# Patient Record
Sex: Female | Born: 1950 | Race: White | Hispanic: No | Smoking: Never smoker
Health system: Southern US, Community
[De-identification: ages and names within clinical notes are randomized; demographics above are authoritative.]

## PROBLEM LIST (undated history)

## (undated) DIAGNOSIS — IMO0002 Reserved for concepts with insufficient information to code with codable children: Secondary | ICD-10-CM

## (undated) DIAGNOSIS — R928 Other abnormal and inconclusive findings on diagnostic imaging of breast: Secondary | ICD-10-CM

## (undated) DIAGNOSIS — I447 Left bundle-branch block, unspecified: Secondary | ICD-10-CM

## (undated) DIAGNOSIS — F32A Depression, unspecified: Secondary | ICD-10-CM

## (undated) DIAGNOSIS — C50912 Malignant neoplasm of unspecified site of left female breast: Secondary | ICD-10-CM

## (undated) DIAGNOSIS — M797 Fibromyalgia: Secondary | ICD-10-CM

## (undated) DIAGNOSIS — M1990 Unspecified osteoarthritis, unspecified site: Secondary | ICD-10-CM

## (undated) DIAGNOSIS — F419 Anxiety disorder, unspecified: Secondary | ICD-10-CM

## (undated) DIAGNOSIS — F329 Major depressive disorder, single episode, unspecified: Secondary | ICD-10-CM

## (undated) DIAGNOSIS — E039 Hypothyroidism, unspecified: Secondary | ICD-10-CM

## (undated) HISTORY — DX: Other abnormal and inconclusive findings on diagnostic imaging of breast: R92.8

## (undated) HISTORY — DX: Left bundle-branch block, unspecified: I44.7

## (undated) HISTORY — DX: Unspecified osteoarthritis, unspecified site: M19.90

## (undated) HISTORY — DX: Hypothyroidism, unspecified: E03.9

## (undated) HISTORY — DX: Malignant neoplasm of unspecified site of left female breast: C50.912

## (undated) HISTORY — PX: APPENDECTOMY: SHX54

## (undated) HISTORY — DX: Major depressive disorder, single episode, unspecified: F32.9

## (undated) HISTORY — DX: Anxiety disorder, unspecified: F41.9

## (undated) HISTORY — PX: MASTECTOMY: SHX3

## (undated) HISTORY — DX: Depression, unspecified: F32.A

## (undated) HISTORY — DX: Fibromyalgia: M79.7

## (undated) HISTORY — DX: Reserved for concepts with insufficient information to code with codable children: IMO0002

---

## 1971-08-18 DIAGNOSIS — IMO0002 Reserved for concepts with insufficient information to code with codable children: Secondary | ICD-10-CM

## 1971-08-18 HISTORY — DX: Reserved for concepts with insufficient information to code with codable children: IMO0002

## 1979-08-18 HISTORY — PX: BUNIONECTOMY: SHX129

## 1987-08-18 HISTORY — PX: PARTIAL HYSTERECTOMY: SHX80

## 1992-08-17 DIAGNOSIS — C50912 Malignant neoplasm of unspecified site of left female breast: Secondary | ICD-10-CM

## 1992-08-17 HISTORY — DX: Malignant neoplasm of unspecified site of left female breast: C50.912

## 2007-08-18 DIAGNOSIS — M797 Fibromyalgia: Secondary | ICD-10-CM

## 2007-08-18 HISTORY — DX: Fibromyalgia: M79.7

## 2008-06-13 ENCOUNTER — Ambulatory Visit: Payer: Self-pay | Admitting: Family Medicine

## 2008-06-13 DIAGNOSIS — J209 Acute bronchitis, unspecified: Secondary | ICD-10-CM | POA: Insufficient documentation

## 2008-06-13 DIAGNOSIS — E039 Hypothyroidism, unspecified: Secondary | ICD-10-CM | POA: Insufficient documentation

## 2008-06-13 DIAGNOSIS — Z853 Personal history of malignant neoplasm of breast: Secondary | ICD-10-CM

## 2008-07-25 ENCOUNTER — Ambulatory Visit: Payer: Self-pay | Admitting: Family Medicine

## 2008-07-25 DIAGNOSIS — R0602 Shortness of breath: Secondary | ICD-10-CM

## 2008-10-05 ENCOUNTER — Ambulatory Visit: Payer: Self-pay | Admitting: Physical Medicine & Rehabilitation

## 2008-10-26 ENCOUNTER — Encounter
Admission: RE | Admit: 2008-10-26 | Discharge: 2008-10-26 | Payer: Self-pay | Admitting: Physical Medicine & Rehabilitation

## 2008-10-26 ENCOUNTER — Ambulatory Visit: Payer: Self-pay | Admitting: Physical Medicine & Rehabilitation

## 2008-11-09 ENCOUNTER — Telehealth: Payer: Self-pay | Admitting: Family Medicine

## 2008-11-23 ENCOUNTER — Encounter
Admission: RE | Admit: 2008-11-23 | Discharge: 2008-11-23 | Payer: Self-pay | Admitting: Physical Medicine & Rehabilitation

## 2008-11-23 ENCOUNTER — Ambulatory Visit: Payer: Self-pay | Admitting: Physical Medicine & Rehabilitation

## 2008-12-14 ENCOUNTER — Ambulatory Visit: Payer: Self-pay | Admitting: Physical Medicine & Rehabilitation

## 2009-01-11 ENCOUNTER — Ambulatory Visit: Payer: Self-pay | Admitting: Physical Medicine & Rehabilitation

## 2009-03-08 ENCOUNTER — Encounter: Admission: RE | Admit: 2009-03-08 | Discharge: 2009-03-08 | Payer: Self-pay | Admitting: Unknown Physician Specialty

## 2009-03-08 ENCOUNTER — Ambulatory Visit: Payer: Self-pay | Admitting: Physical Medicine & Rehabilitation

## 2009-06-13 ENCOUNTER — Ambulatory Visit: Payer: Self-pay | Admitting: Family Medicine

## 2009-11-27 ENCOUNTER — Ambulatory Visit: Payer: Self-pay | Admitting: Obstetrics & Gynecology

## 2010-12-30 NOTE — Consult Note (Signed)
Consult requested by   A 60 year old female with 10-year history of pain all overher body.  She  has had workup with various physicians over the years.  Most recently,  she has been to [facility] here in [place] where the  patient had a EMG/NCV which was normal.  Additional workup including  blood work was also normal.  She has had MRI of the cervical spine  showed a plaque-like lesions in C4 on the right possible, but no  neurologic correlation.  Had C5-6 disk degeneration unchanged since  2004.  MRI of the brain was normal.   NEUROLOGIC EXAMINATION:  Normal.   MEDICATIONS:  She has never tried Lyrica, Cymbalta, or Savella.  She has  been on tramadol in the past which has been partially helpful.  She has  been on Vioxx which was helpful in the past as well as cyclobenzaprine  which was partially helpful.  Diclofenac has also been helpful.  Unfortunately, she had elevated liver enzymes with diclofenac.   PAST MEDICAL HISTORY:  Also positive for hypothyroidism.  They had to  increase her thyroid dosage over the course of last couple months.   Other past medical history is significant for bronchitis, depression,  headache, sleep difficulty.   FAMILY HISTORY:  Arthritis in mother and father.  Cancer in mother and  father   SOCIAL HISTORY:  Married, one child.  Does not work.  No illicit drug  use.  No alcohol use.  No smoking.   Medications currently include Zoloft 100 one and a half per day.  She is  on various multivitamins such as vitamin D, Estra-C.  She is on senna.  She is on Singulair for asthma, Synthroid for hypothyroidism.  She is  also on p.r.n. Astepro inhaler, trazodone 50 mg at night but this gives  her a hangover effect, but helps her sleep.   ALLERGIES:  CEFZIL, CODEINE, and PENICILLIN.   PHYSICAL EXAMINATION:  14 out of 18 fibromyalgia tender points positive  at bilateral suboccipital, bilateral upper medial scapular border,  bilateral sternocostal,  bilateral back, bilateral hip, bilateral knee,  and bilateral elbow.   Her motor strength is 5/5 in bilateral upper and lower extremities.  Range of motion is normal in bilateral upper and lower extremities.  Sensation normal in bilateral upper and lower extremities.  Deep tendon  reflexes are normal in bilateral upper and lower extremities.   Gait is normal.   Back range of motion is 50% forward flexion and extension.   Neck range of motion is full.  No pain to palpation in the lumbar spine.  No evidence of scoliosis.   IMPRESSION:  1. Fibromyalgia syndrome.  She has not been tried any FDA approved      medications in terms of fibromyalgia.  Given that she is already on      Zoloft, we will institute Lyrica 75 b.i.d. and then go up to t.i.d.      after a week and then I will see her back in about 3 weeks to      follow up on this.  2. In terms exercises, this will be the mainstay of her treatment and      we will go ahead and send her to aquatic therapy at the [facility] from      fibromyalgia program.  3. I will go ahead and consider some physical therapy in 1 month.      Should she wish to do some more vigorous exercise, she  will need      some one-on-one instruction through the [facility] Program here      at [facility].  4. Should she not benefit from the Lyrica, we may help to wean down on      the Zoloft and institute Savella.  Other potential treatment      include anti-inflammatories, but this should be more on an      intermittent basis versus some tramadol.   Thank you for this interesting consultation.  I have spent much of the  visit also with education, both the patient and her husband, including  do's and do not's and mechanism.       Erick Colace, M.D.  Electronically Signed     AEK/MedQ  D:10/05/2008 12:23:47  T:10/06/2008 01:58:39  Job #:  [id number]

## 2010-12-30 NOTE — Assessment & Plan Note (Signed)
A 60 year old female with fibromyalgia syndrome.  She started a low  impact water aerobics, getting some monthly outpatient physical therapy  mainly manual therapy.  Trialed on a TENS unit, had some good temporary  improvement in the PT Clinic, does not have a home unit.  She had a  switch of her medications.  We reduced her Zoloft over the course of 2  weeks from 150 to 0 and started her on Cymbalta 30 mg a day.  We also  took off her Lyrica.  Her mental status is much more clear.  She feels  like she has really had no confusion anymore.  Her anxiety seems a  little bit more than it was before.  We have even took her mother's  Xanax a couple of times, but I cautioned her that this is not only a bad  idea from medical standpoint, but illegal.   Her average pain is 7/10.  Her pain is sharp, burning, dull, stabbing,  tingling, aching, interferes with activity at 7/10 level.  Sleep is  poor.  Pain improves with heat therapies, pacing activities.  She can  walk 20-30 minutes at a time.  She climbs steps.  She drives.  Last  employed 2009.  Pain interferes with meal prep, household duties,  shopping.   REVIEW OF SYSTEMS:  Positive for confusion, depression, anxiety,  coughing, respiratory infections.  Has been seeing her allergist as well  as a cardiologist since she had what it sounds like an adenosine stress  test which was reportedly normal.  Her Oswestry disability index score  of 62%.   Blood pressure 122/72.   Palpation for fibromyalgia tender points upper medial scapular border,  right upper trap, right elbow, total 4.   Motor strength is 5/5 in bilateral lower and upper extremities.  Hip,  knee, and ankle range of motion are normal.   IMPRESSION:  Fibromyalgia syndrome.  She also has depression.  She asked  me whether it was because her depression improved or her fibromyalgia  improved that she is overall doing better.   I think that the Lyrica was causing sedation.  Her  Cymbalta seems to be  controlling depression as well as fibromyalgia symptoms, but some of the  anxiety has been increased likely as a result of coming off the Lyrica.  We will increase her Cymbalta to 60 mg a day.  I will see her back in 2  months.  If she does poorly on this increased dosage, she will call me  back and I will see her sooner.      Erick Colace, M.D.  Electronically Signed     AEK/MedQ  D:  01/11/2009 10:36:23  T:  01/11/2009 23:03:30  Job #:  [id number]   cc:   Dr. Zane Herald, M.D.  Fax: [phone]

## 2010-12-30 NOTE — Assessment & Plan Note (Signed)
Ms. Roeper follows up today.   REASON FOR VISIT:  Fibromyalgia syndrome.   A 60 year old female with fibromyalgia syndrome, has started with low  impact water aerobics, is getting some outpatient physical therapy in  [place].  She had x-rays of her knees showed no evidence of DJD,  questionable loose body, but really this has not been a major issue.  Her major concern is all over body pain.   Lyrica has been causing some drowsiness, dizziness, and wonders if she  can go down on this.  It is 50%, which is baseline versus last visit.   REVIEW OF SYSTEMS:  Positive numbness, tingling, confusion, depression,  anxiety, dizziness, constipation, and swelling.   PHYSICAL EXAMINATION:  VITAL SIGNS:  Blood pressure 111/64 and pulse 78.  BACK:  No tenderness to palpation except a lumbosacral junction.  She  has pain in the upper traps.  Lower extremity and upper extremity  strength are normal.  Range of motion is normal in lower extremities.   IMPRESSION:  Fibromyalgia syndrome.  She is on Lyrica, has side effects  that she does not tolerate, therefore, we will taper down to b.i.d. for  a week, then daily for a week, then discontinue.   We will start Cymbalta 30 mg a day.   We will reduce her Zoloft from 150 daily to 100 daily for a week, and  then to 50 daily for a week, and then discontinue.   Discussed that she might feel nauseated or just a bit off with these  medication changes for the next couple of weeks.  I will see her back in  about a months' time.      Erick Colace, M.D.  Electronically Signed     AEK/MedQ  D:  12/14/2008 12:03:17  T:  12/15/2008 02:23:44  Job #:  [id number]   cc:   Dr. Orie Rout   Dr. Zane Herald, M.D.  Fax: [phone]

## 2010-12-30 NOTE — Assessment & Plan Note (Signed)
A 60 year old female with fibromyalgia syndrome, last seen by me  approximately 1 month ago.  She has followed through and gone to low  impact water aerobics.  She is also started with some physical therapy  in [place].  We have increased her Lyrica to 150 b.i.d., which has  been somewhat helpful, but still do not seem to be getting rid of her  pain as much as she thought it would.  Overall, her mood and affect  appear brighter.  She had x-rays done of bilateral knees and these show  no evidence of DJD, questionable right knee loose bodies.  She does  admits some clicking, but this is really both knees; her right knee may  be a little bit worse than left, but no big differences.  Her visual  analogue scale at last visit was 8, now it is 7.  Her pain interference  scores were 8-9 last visit, now at 5-8 level.   Her Oswestry disability index score, last visit was 60% and 50% at this  visit which is a significant improvement in function.  She can walk 30-  60 minutes at a time.  She drives.  She is independent with all of her  self-care mobility, but needs help with some of her shopping and  household duty tasks.   REVIEW OF SYSTEMS:  Positive confusion, depression, anxiety, dizziness,  numbness, tingling, weakness, constipation, limb swelling, and wheezing.  She does follow up with the primary care physician.   PHYSICAL EXAMINATION:  VITAL SIGNS:  Her blood pressure 115/67, weight  172 pounds.   Palpation for fibromyalgia tender points, no significantly positive  fibromyalgia points at this time.  She complains of her back a lot.  She  has pain with extension of her lumbar spine and has about 50% range  whereas she has about 75% range of forward flexion.  Lower extremity  strength is normal.   IMPRESSION:  1. Fibromyalgia syndrome.  Overall improvement in function and pain.      Sleep has improved as well on Savella as well as her exercise.  2. Back pain, this maybe a facet  arthropathy.  She has pain with      extension greater than flexion.  No radicular symptomatology.  We      will check some x-rays.  3. 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  I have asked      her therapist to include some terminal knee extension exercises as      well as some lumbar flexion exercises.      Erick Colace, M.D.  Electronically Signed     AEK/MedQ  D:  11/23/2008 12:25:55  T:  11/24/2008 04:16:46  Job #:  [id number]

## 2010-12-30 NOTE — Assessment & Plan Note (Signed)
A 60 year old female with fibromyalgia syndrome.  She has done  relatively well on the Cymbalta with increased dose from 30 to 60.  The  main pain complaint right now is at right knee.  She has been having  right knee pain despite physical therapy.  She has been going to  physical therapy as well as aquatic therapy.  She has less pain with the  aquatic and the regular physical.  She has her last appointment coming  up.  She has had no falls.  No new trauma.  No fevers or other  infections.   Pain level is 5-6/10, basically both above and below the waist, shoulder  back, thigh, and knee area.  Her sleep is poor.  Pain improves with  heat, therapy, medications, and TENS.   FUNCTIONAL STATUS:  She needs help with meal prep, household duties,  shopping.   REVIEW OF SYSTEMS:  Positive for numbness, tingling, confusion,  depression, anxiety, bladder problems, weight gain, and constipation.   PHYSICAL EXAMINATION:  VITAL SIGNS:  Blood pressure 119/74, pulse 94,  and weight 174.  GENERAL:  No acute distress.  Mood and affect is anxious.  MUSCULOSKELETAL:  Her back has no significant tenderness to palpation.  Her fibromyalgia tender points is essentially nontender.  Right knee has  no evidence of effusion.  No joint swelling.  She has good range of  motion at the knee.  She has tenderness at the patella with ballottement  and medial lateral stress.   IMPRESSION:  1. Fibromyalgia syndrome, overall improving.  2. Patellofemoral disorder.  3. Depression and anxiety.   PLAN:  1. We will get a patellar sunrise view.  2. Add terminal knee extension exercises to her physical therapy      program.  3. We will increase her Cymbalta to 90 mg, this is really not so much      for the pain aspect but the depression and anxiety, usually a      maximum benefit for fibromyalgia seen at 60 mg based on studies.      Erick Colace, M.D.  Electronically Signed     AEK/MedQ  D:  03/08/2009  11:42:00  T:  03/09/2009 01:21:54  Job #:  [id number]

## 2010-12-30 NOTE — Assessment & Plan Note (Signed)
A 60 year old female with 10-year history of pain all over body work up  with various physicians over the years and has seen [facility]  in [place].  Has had EMG and CV.  Primary care checked her thyroid  level which was okay recently.   CURRENT MEDICATIONS:  Trialed on Lyrica, I reviewed her pain scores.  In  the first couple of days, she has went down from 6-7 range down to the 2-  3 range but then has been up in the 5-6 range.  She has tolerated  masses.  No side effects.  She has had no swelling.  She is up to 75  t.i.d.   In the past, she has been on diclofenac which has been helpful p.o. but  could not tolerate due to elevated liver enzymes.   PAST MEDICAL HISTORY:  Hypothyroidism as noted above.   SOCIAL HISTORY:  Lives with her husband.  She is pursuing disability as  be whether I can fill out disability papers on her.   PHYSICAL EXAMINATION:  A 14 out of 18 fibromyalgia tender points  positive, bilateral suboccipital, bilateral upper medial scapular  border, bilateral sternocostal, bilateral back, bilateral hip, bilateral  knee, bilateral elbow.  Motor strength 5/5 in upper and lower  extremities.  Knees have no ligamentous instability.  No evidence of  effusion.  No crepitus.  Gait is normal.  Neck range of motion is full.   IMPRESSION:  1. Fibromyalgia syndrome.  We will bump up her Lyrica to 150 b.i.d.  I      did indicate that studies have shown efficacy at an average of 450      mg and we will have to slowly get up to that point.  2. I have advised her to go to aquatic therapy with fibromyalgia      program at the [facility].  She has been doing some water walking, but      really not a class.  3. X-rays of bilateral knees.  Exam is fairly unremarkable, but she      complains of pain particularly in that area.  We will see whether      or not she has any evidence of osteoarthritis, have written some of      Voltaren gel for that.   I will see her back in 1  month in followup.      Erick Colace, M.D.  Electronically Signed     AEK/MedQ  D:  10/26/2008 12:09:26  T:  10/27/2008 00:03:42  Job #:  [id number]

## 2010-12-30 NOTE — Assessment & Plan Note (Signed)
NAME:  Melissa Lewis, Melissa Lewis                ACCOUNT NO.:  [id number]   MEDICAL RECORD NO.:  [id number]          PATIENT TYPE:  POB   LOCATION:  [facility] at [place]         FACILITY:  [facility]   PHYSICIAN:  Allie Bossier, MD        DATE OF BIRTH:  [birth year]   DATE OF SERVICE:  11/27/2009                                  CLINIC NOTE   Melissa Lewis is a 60 year old married white gravida 1, para 1 who has a  55 year old son and two grand kids.  She comes in here today complaining  of crying spells, mood swings, insomnia, feeling tired, irritability,  approximately for the last several weeks, this all seemed to start when  her antidepressants were changed.  She was initially taken off her  Cymbalta and changed to Effexor but then that was stopped yesterday and  her Cymbalta was stopped 3 weeks ago.  She was started on Zoloft  yesterday by Dr. Leretha Pol at 50 mg a day and she is instructed  to increase it to 100 mg a day starting next week.  She had hot flashes  for the last 8 years during the last month, they have been much worse.   PAST MEDICAL HISTORY:  Significant for left breast cancer, treated in  1994 with radiation, and fibromyalgia.  She has in the past seen a pain  management doctor most recently, Dr. Kellie Shropshire.  She also reports that she  has been abstinent for the last 3 years due to absence of libido.  She  is currently been married for 2 years to her third husband.  She is on  disability and has been on either Prozac and Zoloft for many, many  years.   PHYSICAL EXAMINATION:  GENERAL:  On exam, Melissa Lewis is a somewhat  distraught white female who would cry periodically during the  discussion.  She denies suicidal or homicidal ideations.  I have  explained to her that I believe most of her problem is related to the  changing of her brain chemistry due to the recent changes of her  antidepressant.  I believe that Zoloft will be the answer for her and I  would recommend that Dr. Leretha Pol continue to manage her Zoloft.  She is clearly not a candidate for estrogen since she has had breast  cancer.  I have recommended that she schedule an annual exam.  I have  told her that she does not need a Pap smear but she does still need a  gynecologic physical exam as well.      Allie Bossier, MD     MCD/MEDQ  D:  11/27/2009  T:  11/28/2009  Job:  [id number]

## 2012-03-16 ENCOUNTER — Ambulatory Visit (HOSPITAL_COMMUNITY): Payer: Self-pay | Admitting: Licensed Clinical Social Worker

## 2012-03-31 ENCOUNTER — Ambulatory Visit (INDEPENDENT_AMBULATORY_CARE_PROVIDER_SITE_OTHER): Payer: MEDICARE | Admitting: Licensed Clinical Social Worker

## 2012-03-31 DIAGNOSIS — F329 Major depressive disorder, single episode, unspecified: Secondary | ICD-10-CM
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
## 2012-04-05 DIAGNOSIS — F32A Depression, unspecified: Secondary | ICD-10-CM | POA: Insufficient documentation

## 2012-04-05 DIAGNOSIS — F329 Major depressive disorder, single episode, unspecified: Secondary | ICD-10-CM | POA: Insufficient documentation

## 2012-05-03 ENCOUNTER — Telehealth (HOSPITAL_COMMUNITY): Payer: Self-pay | Admitting: Licensed Clinical Social Worker

## 2012-05-03 ENCOUNTER — Ambulatory Visit (HOSPITAL_COMMUNITY): Payer: Self-pay | Admitting: Licensed Clinical Social Worker

## 2012-05-25 ENCOUNTER — Ambulatory Visit (INDEPENDENT_AMBULATORY_CARE_PROVIDER_SITE_OTHER): Payer: MEDICARE | Admitting: Licensed Clinical Social Worker

## 2012-05-25 DIAGNOSIS — F32A Depression, unspecified: Secondary | ICD-10-CM

## 2012-05-25 DIAGNOSIS — F329 Major depressive disorder, single episode, unspecified: Secondary | ICD-10-CM

## 2012-05-25 NOTE — Progress Notes (Signed)
THERAPIST PROGRESS NOTE  Session Time: 2:00 - 3:00  Participation Level: Active  Behavioral Response: NeatAlertDepressed  Type of Therapy: Family Therapy  Treatment Goals addressed: Anxiety and Coping  Interventions: Motivational Interviewing, Supportive, Family Systems and Reframing  Summary: Yailen Zemaitis is a 61 y.o. female who presents with depression.  Elajah came in with her son Lorin Picket.  She is not feeling as good as she was.  She is having difficulty not feeling like an out cast in the family which is a feeling she has had for a long time - not just associated with her separation.  Her ex has reneged on his agreement to pay off her car.  He will pay her car payment until Dec when the divorce will be final. She is very upset about that and she said he changed his mind after she told him she could not see him as a friend anymore.  She found herself upset and anxious around him.  Then he reneged on his agreement which has upset her.  She is also upset with her relationship with her mother.  She realizes they are too much alike and live right next door to each other.  Her mother did need to move from her trailer in another part of [place].  It was felt to be a good idea so if something happened she would be near family - Lacresha's sister lives across the street.  Her mother is 70 years old.  Being alike they tend to trigger each other. Airis's ex had ordered her mother and father the life alert and had agreed to pay the bill which he did for her father until he died.  Now because her mother told him that Leeana did not want the family to have relationships with him, he has stopped paying that bill also.  Pointed out to [place] ant to her son that it sounds like his agreement to pay something is a manipulation for if he cared so much for her mother, he would pay the bill anyway for it has nothing to do with [name]. Scot plans to point that out to grandmother.  For it is feeling like Kearia ends up feeling  pressure to be friends with him because of his reneging on agreements.  It is her fault.    [name] explained that the family feels that her ex is nice to everyone and because of his mother's history in relationships, they are feeling that she has a part in the problem too.  Pointed out that when there is a relationship problem there are two people involved - question whether they see that he may have had a part in the situation too;  The family is not inviting him to family events that Phillis should be attending anymore.  They did not see why they cannot have there own separate relationship with him.  Scot said he saw some signs of  what hsi mother is talking about and feels more inclined to believe his mother.  He agrees that the problems that his mother has with the family have been there for years.  It has only gotten worse since the separation.  He would like to see her get help with her part of the problem.  Ziyan claims that she cannot afford the co-pay especially since she had to start paying her car insurance.  Suggested applying for financial aid from the hospital.  Her son did say he would help but she will not accept that.  She  was having trouble understanding therapist direction 1. Apply for financial help, 2. Find counseling services that may not require co-pay.  She says she wants to work on the problem that she has but then comes up with all sorts of reasons that she cannoot - either she cannot afford it or she cannot change after all these years.  Asked her if she wanted to change and she said she did - would she do anything to change and she agreed.  She clearly has trouble seeing a way out of a problem.  [name] commented that he has seen that before.  Suicidal/Homicidal: Nowithout intent/plan  Plan: Return again if able to get financial assistance.  Diagnosis: Axis I: Depressive Disorder NOS    Axis II: Deferred    Merie Wulf,JUDITH A, LCSW 05/25/2012

## 2012-06-01 ENCOUNTER — Ambulatory Visit (INDEPENDENT_AMBULATORY_CARE_PROVIDER_SITE_OTHER): Payer: MEDICARE | Admitting: Licensed Clinical Social Worker

## 2012-06-01 DIAGNOSIS — F329 Major depressive disorder, single episode, unspecified: Secondary | ICD-10-CM

## 2012-06-01 NOTE — Patient Instructions (Addendum)
Find and get an appointment with a rheumatologist to have fibromyalgia re-evaluated Get an appointment with Dr. Laury Deep for a psychiatric evaluation - to evaluate psychiatric medications Go back to water aerobic classes You do not have to make any decisions today Taking one day at a time No need to discuss his other relationship - it did not interfere with her relationship with him - she may have but he did not
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
## 2012-06-07 ENCOUNTER — Telehealth (HOSPITAL_COMMUNITY): Payer: Self-pay | Admitting: Licensed Clinical Social Worker

## 2012-06-07 ENCOUNTER — Encounter (HOSPITAL_COMMUNITY): Payer: Self-pay | Admitting: Psychiatry

## 2012-06-07 ENCOUNTER — Ambulatory Visit (INDEPENDENT_AMBULATORY_CARE_PROVIDER_SITE_OTHER): Payer: MEDICARE | Admitting: Psychiatry

## 2012-06-07 VITALS — BP 130/83 | HR 76 | Ht 59.5 in | Wt 165.0 lb

## 2012-06-07 DIAGNOSIS — F332 Major depressive disorder, recurrent severe without psychotic features: Secondary | ICD-10-CM

## 2012-06-07 DIAGNOSIS — F329 Major depressive disorder, single episode, unspecified: Secondary | ICD-10-CM

## 2012-06-07 MED ORDER — SERTRALINE HCL 100 MG PO TABS: 200.0000 mg | ORAL_TABLET | Freq: Every day | ORAL | Status: DC
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
## 2012-06-08 ENCOUNTER — Ambulatory Visit (INDEPENDENT_AMBULATORY_CARE_PROVIDER_SITE_OTHER): Payer: MEDICARE | Admitting: Licensed Clinical Social Worker

## 2012-06-08 DIAGNOSIS — F329 Major depressive disorder, single episode, unspecified: Secondary | ICD-10-CM

## 2012-06-08 LAB — CBC WITH DIFFERENTIAL/PLATELET
Basophils Absolute: 0 10*3/uL (ref 0.0–0.1)
Basophils Relative: 1 % (ref 0–1)
Hemoglobin: 13.9 g/dL (ref 12.0–15.0)
MCHC: 33.8 g/dL (ref 30.0–36.0)
Monocytes Relative: 11 % (ref 3–12)
Neutro Abs: 2.9 10*3/uL (ref 1.7–7.7)
Neutrophils Relative %: 50 % (ref 43–77)

## 2012-06-08 LAB — LIPID PANEL
Cholesterol: 166 mg/dL (ref 0–200)
Triglycerides: 130 mg/dL (ref <150)

## 2012-06-08 LAB — COMPLETE METABOLIC PANEL WITH GFR
BUN: 10 mg/dL (ref 6–23)
CO2: 29 mEq/L (ref 19–32)
Calcium: 9.7 mg/dL (ref 8.4–10.5)
Chloride: 105 mEq/L (ref 96–112)
Creat: 0.72 mg/dL (ref 0.50–1.10)
GFR, Est African American: >89 mL/min
Glucose, Bld: 79 mg/dL (ref 70–99)

## 2012-06-08 NOTE — Progress Notes (Signed)
   THERAPIST PROGRESS NOTE  Session Time: 2:10 - 3:00  Participation Level: Active  Behavioral Response: CasualDrowsyDepressed  Type of Therapy: Individual Therapy  Treatment Goals addressed: Coping  Interventions: Motivational Interviewing and Supportive  Summary: Melissa Lewis is a 61 y.o. female who presents with .depression.  Melissa Lewis is definitely not with her BF anymore. She is not feeling well today - having pain and weakness.  She is struggling with feeling good - her physical pain does not help.  She ended up having to call her brother and sister-in-law to pick her up for she was too weak to get home on her own.  He arms get limp and it is a struggle to get up and get out.  I assisted her - she refused a wheelchair.  - When she got out to wait room I was able to talk her into a wheel chair   Before she left the building.  Her sister-in-law was very kind and concerned and said she has these episodes.  She had no problem picking her up - she certainly did not seem to be an unconcerned relative.  She left in a wheel chair with her family.   Suicidal/Homicidal: Nowithout intent/plan  Plan: Return again in 1 weeks.  Diagnosis: Axis I: Depressive Disorder NOS    Axis II: Deferred    Mycheal Veldhuizen,JUDITH A, LCSW 06/08/2012

## 2012-06-15 ENCOUNTER — Ambulatory Visit (HOSPITAL_COMMUNITY): Payer: Self-pay | Admitting: Licensed Clinical Social Worker

## 2012-06-23 ENCOUNTER — Ambulatory Visit (INDEPENDENT_AMBULATORY_CARE_PROVIDER_SITE_OTHER): Payer: No Typology Code available for payment source | Admitting: Psychiatry

## 2012-06-23 ENCOUNTER — Encounter (HOSPITAL_COMMUNITY): Payer: Self-pay | Admitting: Psychiatry

## 2012-06-23 VITALS — BP 130/68 | HR 78 | Ht 59.5 in | Wt 164.0 lb

## 2012-06-23 DIAGNOSIS — F332 Major depressive disorder, recurrent severe without psychotic features: Secondary | ICD-10-CM

## 2012-06-23 DIAGNOSIS — F329 Major depressive disorder, single episode, unspecified: Secondary | ICD-10-CM

## 2012-06-23 DIAGNOSIS — F333 Major depressive disorder, recurrent, severe with psychotic symptoms: Secondary | ICD-10-CM | POA: Insufficient documentation

## 2012-06-23 MED ORDER — ARIPIPRAZOLE 5 MG PO TABS: ORAL_TABLET | ORAL | Status: DC

## 2012-06-23 MED ORDER — SERTRALINE HCL 100 MG PO TABS: 200.0000 mg | ORAL_TABLET | Freq: Every day | ORAL | Status: DC
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
## 2012-06-24 ENCOUNTER — Ambulatory Visit (HOSPITAL_COMMUNITY): Payer: Self-pay | Admitting: Psychiatry

## 2012-06-24 LAB — T4, FREE: Free T4: 1.08 ng/dL (ref 0.80–1.80)

## 2012-06-24 LAB — T3, FREE: T3, Free: 2.7 pg/mL (ref 2.3–4.2)

## 2012-06-27 ENCOUNTER — Telehealth (HOSPITAL_COMMUNITY): Payer: Self-pay

## 2012-06-27 NOTE — Telephone Encounter (Signed)
Called patient. Left Message that I would call in AM.

## 2012-06-28 NOTE — Telephone Encounter (Signed)
The patient reports she is not sleeping despite 3 melatonin.   PLAN: Advised trial of benadryl 25 -50 mg for sleep.

## 2012-07-05 ENCOUNTER — Ambulatory Visit (HOSPITAL_COMMUNITY): Payer: Self-pay | Admitting: Psychiatry

## 2012-07-05 ENCOUNTER — Ambulatory Visit (INDEPENDENT_AMBULATORY_CARE_PROVIDER_SITE_OTHER): Payer: No Typology Code available for payment source | Admitting: Licensed Clinical Social Worker

## 2012-07-05 DIAGNOSIS — F32A Depression, unspecified: Secondary | ICD-10-CM

## 2012-07-05 DIAGNOSIS — F329 Major depressive disorder, single episode, unspecified: Secondary | ICD-10-CM

## 2012-07-05 NOTE — Progress Notes (Signed)
   THERAPIST PROGRESS NOTE  Session Time: 10:05 - 11:00  Participation Level: Active  Behavioral Response: CasualAlertEuthymic  Type of Therapy: Individual Therapy  Treatment Goals addressed: Coping, Decisions that involve pleasing herself  Interventions: Motivational Interviewing and Supportive  Summary: Melissa Lewis is a 61 y.o. female who presents with anxiety.  Melissa Lewis reports that she feels much better - she feels the medication is working.  She is not crying all the time like she was before.  The main problem now is that she wants to move from where she is because they have [place] rules that she does not like.  The main one being they can't put any thing on the balcony and she loves to decorated her balcony..  She has found some apartments that she likes; the main problem is feeling guilty for leaving her mother. Her sister is across the street and her cousin is upstairs so there are people close by who can look in on her mother.  Her mother is reassuring her that it is fine for her to move and she is not that far away that she cannot visit.  She struggles with feelings about doing the right thing.  She and her husband will be divorced in a couple of weeks.  He is not seeing a lot of her family but he did visit with her mother the other day and it did bother her because she saw him and he was right next door.  She realizes she has to to let bo of him and stop letting it bother her if he visits her family.  She is also finding herself bored - she needs more to do.  Discussed volunteer jobs that she might do and she could work part-time if she did not make much money.  She does worry about money and her fixed income.  She did pay off her car with money from her father's estate so she does not have a car payment.  She feels like meeting a man - she does not want to get married again but she would like the companionship.  She met her second husband through a dating service and she does not want to do  that again.  Counselor was encouraging and supportive to her increased independence and structure in her life.    Suicidal/Homicidal: Nowithout intent/plan  Plan: Return again in 3 weeks.  Diagnosis: Axis I: Major depressive disorder, recurrent    Axis II: Deferred    Melissa Lewis,JUDITH A, LCSW 07/05/2012

## 2012-07-20 ENCOUNTER — Ambulatory Visit (INDEPENDENT_AMBULATORY_CARE_PROVIDER_SITE_OTHER): Payer: No Typology Code available for payment source | Admitting: Licensed Clinical Social Worker

## 2012-07-20 DIAGNOSIS — F329 Major depressive disorder, single episode, unspecified: Secondary | ICD-10-CM

## 2012-07-20 DIAGNOSIS — F332 Major depressive disorder, recurrent severe without psychotic features: Secondary | ICD-10-CM

## 2012-07-21 ENCOUNTER — Encounter (HOSPITAL_COMMUNITY): Payer: Self-pay | Admitting: Psychiatry

## 2012-07-21 ENCOUNTER — Ambulatory Visit (INDEPENDENT_AMBULATORY_CARE_PROVIDER_SITE_OTHER): Payer: No Typology Code available for payment source | Admitting: Psychiatry

## 2012-07-21 VITALS — BP 117/75 | HR 77 | Ht 59.5 in | Wt 169.0 lb

## 2012-07-21 DIAGNOSIS — F332 Major depressive disorder, recurrent severe without psychotic features: Secondary | ICD-10-CM

## 2012-07-21 DIAGNOSIS — F329 Major depressive disorder, single episode, unspecified: Secondary | ICD-10-CM

## 2012-07-21 MED ORDER — SERTRALINE HCL 100 MG PO TABS: 200.0000 mg | ORAL_TABLET | Freq: Every day | ORAL | Status: DC

## 2012-07-21 MED ORDER — ARIPIPRAZOLE 5 MG PO TABS: 5.0000 mg | ORAL_TABLET | Freq: Every day | ORAL | Status: DC
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
## 2012-07-25 NOTE — Progress Notes (Signed)
   THERAPIST PROGRESS NOTE  Session Time: 3;00 - 4;00  Participation Level: Active  Behavioral Response: CasualAlertEuthymic  Type of Therapy: Individual Therapy  Treatment Goals addressed: Anxiety  Interventions: Motivational Interviewing and Supportive  Summary: Melissa Lewis is a 61 y.o. female who presents with pleasant mood.  She reports that she is feeling better.  Feels like the medication is helping especially with the addition of abillify.  The problem that she has is that she ran out of the abillify y and she cannot afford how much it costs until the first to fhe hear. Because she is in the donut hole.  Dicussed options and even checked with company about assistance.  She may just pay for it since her insurance starts up again in January.  She is moving shortly and everyone is ok with that and she is looking forward to her new place.  She met a make friend who she has gone out with but she is not interested in getting serious..   Suicidal/Homicidal: Nowithout intent/plan  Therapist Response: She is insecure about making decisions on her own - needs to be encouraged and reassured that she can do that quite well.  Plan: Return again in 2 weeks.  Diagnosis: Axis I: Depressive Disorder NOS    Axis II: Deferred    Borden Thune,JUDITH A, LCSW 07/25/2012 3:00

## 2012-08-04 ENCOUNTER — Ambulatory Visit (HOSPITAL_COMMUNITY): Payer: Self-pay | Admitting: Licensed Clinical Social Worker

## 2012-08-15 ENCOUNTER — Other Ambulatory Visit (HOSPITAL_COMMUNITY): Payer: Self-pay | Admitting: Psychiatry

## 2012-08-16 ENCOUNTER — Ambulatory Visit (INDEPENDENT_AMBULATORY_CARE_PROVIDER_SITE_OTHER): Payer: No Typology Code available for payment source | Admitting: Licensed Clinical Social Worker

## 2012-08-16 DIAGNOSIS — F3289 Other specified depressive episodes: Secondary | ICD-10-CM

## 2012-08-16 DIAGNOSIS — F329 Major depressive disorder, single episode, unspecified: Secondary | ICD-10-CM

## 2012-08-18 NOTE — Progress Notes (Signed)
   THERAPIST PROGRESS NOTE  Session Time: 4:10 - 5:00  Participation Level: Active  Behavioral Response: NeatAlertDepressed  Type of Therapy: Individual Therapy  Treatment Goals addressed: Anger  Interventions: Motivational Interviewing and Supportive  Summary: Melissa Lewis is a 62 y.o. female who presents with a depressed mood   She states that she is not as happy with her move as she thought she would be.  She does not like the kitchen cabinets, they did not put new carpet in as promised and she cannot fit her rug in her bedroom.  She is taking turns stay ing with her mother.  She cannot be alone over night.  Her sister had Christmas and she was not happy with her because she would not let her BF come because she just wanted family.  She is not getting together with her ex anymore.  She blew up at her sister and was mean to her.  In discussing the situation it was clear that she want angry with her about Christmas and never expressed that in a clear way so some little thing happened and she blew up out of proportion and then the family thinks she has a problem  Her problem is simply not being clear about her feelings and expressing them at the appropriate time.. She and her new BF are talking about living together - it seemed like it is too soon to make that decision.  She expressed that concern.  He has a lease running out in Feb and that is why this came up but he can renew his lease.  They have only known each other for three months.  She expresses that fact that she is lonely - that feeling propels her into early decisions    Suicidal/Homicidal: Nowithout intent/plan  Therapist Response: Discussed how she needs to pay attention to how she feels and start expressing those feelings.  If she has a big anger, she needs to stop from expressing and figure out what she is really angry about.  Plan: Return again in 2 weeks.  Diagnosis: Axis I: Depressive Disorder NOS    Axis II:  Deferred    Tremeka Helbling,JUDITH A, LCSW 08/18/2012

## 2012-08-22 ENCOUNTER — Ambulatory Visit (HOSPITAL_COMMUNITY): Payer: Self-pay | Admitting: Psychiatry

## 2012-08-23 ENCOUNTER — Telehealth (HOSPITAL_COMMUNITY): Payer: Self-pay | Admitting: Psychiatry

## 2012-08-23 ENCOUNTER — Ambulatory Visit (HOSPITAL_COMMUNITY): Payer: Self-pay | Admitting: Psychiatry

## 2012-08-23 DIAGNOSIS — F329 Major depressive disorder, single episode, unspecified: Secondary | ICD-10-CM

## 2012-08-23 MED ORDER — ARIPIPRAZOLE 5 MG PO TABS: 5.0000 mg | ORAL_TABLET | Freq: Every day | ORAL | Status: DC

## 2012-08-23 MED ORDER — SERTRALINE HCL 100 MG PO TABS: 200.0000 mg | ORAL_TABLET | Freq: Every day | ORAL | Status: DC

## 2012-08-23 NOTE — Telephone Encounter (Signed)
Patient called. Her mother passed away and will not be able to come for appointment today.   PLAN: Will refill patient's medications with one refill.

## 2012-09-26 ENCOUNTER — Encounter (HOSPITAL_COMMUNITY): Payer: Self-pay | Admitting: Psychiatry

## 2012-09-26 ENCOUNTER — Ambulatory Visit (INDEPENDENT_AMBULATORY_CARE_PROVIDER_SITE_OTHER): Payer: No Typology Code available for payment source | Admitting: Psychiatry

## 2012-09-26 VITALS — BP 136/73 | HR 85 | Ht 59.5 in | Wt 173.0 lb

## 2012-09-26 DIAGNOSIS — F333 Major depressive disorder, recurrent, severe with psychotic symptoms: Secondary | ICD-10-CM

## 2012-09-26 MED ORDER — SERTRALINE HCL 100 MG PO TABS: 200.0000 mg | ORAL_TABLET | Freq: Every day | ORAL | Status: DC

## 2012-09-26 MED ORDER — RISPERIDONE 0.5 MG PO TABS: ORAL_TABLET | ORAL | Status: DC
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
## 2012-10-06 ENCOUNTER — Ambulatory Visit (INDEPENDENT_AMBULATORY_CARE_PROVIDER_SITE_OTHER): Payer: No Typology Code available for payment source | Admitting: Licensed Clinical Social Worker

## 2012-10-06 DIAGNOSIS — F329 Major depressive disorder, single episode, unspecified: Secondary | ICD-10-CM

## 2012-10-10 ENCOUNTER — Encounter (HOSPITAL_COMMUNITY): Payer: Self-pay | Admitting: Psychiatry

## 2012-10-10 ENCOUNTER — Ambulatory Visit (INDEPENDENT_AMBULATORY_CARE_PROVIDER_SITE_OTHER): Payer: No Typology Code available for payment source | Admitting: Psychiatry

## 2012-10-10 VITALS — BP 111/69 | HR 83 | Ht 59.5 in | Wt 172.0 lb

## 2012-10-10 DIAGNOSIS — F333 Major depressive disorder, recurrent, severe with psychotic symptoms: Secondary | ICD-10-CM

## 2012-10-10 MED ORDER — SERTRALINE HCL 100 MG PO TABS: 200.0000 mg | ORAL_TABLET | Freq: Every day | ORAL | Status: DC

## 2012-10-10 MED ORDER — RISPERIDONE 1 MG PO TABS: 1.0000 mg | ORAL_TABLET | Freq: Every day | ORAL | Status: DC
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
## 2012-10-12 NOTE — Progress Notes (Signed)
   THERAPIST PROGRESS NOTE  Session Time: 11:00 - 12:00  Participation Level: Active  Behavioral Response: NeatAlertEuthymic  Type of Therapy: Individual Therapy  Treatment Goals addressed: Anxiety and Coping  Interventions: Motivational Interviewing and Supportive  Summary: Melissa Lewis is a 62 y.o. female who presents with a pleasant mood.  [name] reported that since she has been here her mother has passed away.  Her lung CA returned - fluid built up in her body and they could not get it out enough. She was really sad and surprised that it happened so quickly.  She is feeling like an orphan - both parents are gone.  She has also moved back to where she lived when he mother was alive.  She is much happier.  She is right across the street from her sister who is being very supportive.  She has completed her divorce from her husband and he is not around her family when she is there anymore.  She goes regularly to her church [employer].  So she is getting support for herself..   Suicidal/Homicidal: No  Therapist Response: She does not like to be alone - she has broken off with the last BF and she is open to other relationships.  Her dependency may get in her way in making good decisions for herself.  Plan: Return again in 3 weeks.  Diagnosis: Axis I: Depressive Disorder NOS    Axis II: Deferred    Autumm Hattery,JUDITH A, LCSW 10/12/2012

## 2012-10-24 ENCOUNTER — Ambulatory Visit (INDEPENDENT_AMBULATORY_CARE_PROVIDER_SITE_OTHER): Payer: No Typology Code available for payment source | Admitting: Psychiatry

## 2012-10-24 ENCOUNTER — Encounter (HOSPITAL_COMMUNITY): Payer: Self-pay | Admitting: Psychiatry

## 2012-10-24 VITALS — BP 119/82 | HR 82 | Ht 59.5 in | Wt 172.0 lb

## 2012-10-24 DIAGNOSIS — F333 Major depressive disorder, recurrent, severe with psychotic symptoms: Secondary | ICD-10-CM
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
## 2012-10-25 ENCOUNTER — Ambulatory Visit (HOSPITAL_COMMUNITY): Payer: Self-pay | Admitting: Psychiatry

## 2012-11-07 ENCOUNTER — Ambulatory Visit (INDEPENDENT_AMBULATORY_CARE_PROVIDER_SITE_OTHER): Payer: No Typology Code available for payment source | Admitting: Psychiatry

## 2012-11-07 ENCOUNTER — Encounter (HOSPITAL_COMMUNITY): Payer: Self-pay | Admitting: Psychiatry

## 2012-11-07 VITALS — BP 112/65 | HR 74 | Ht 59.5 in | Wt 170.0 lb

## 2012-11-07 DIAGNOSIS — F333 Major depressive disorder, recurrent, severe with psychotic symptoms: Secondary | ICD-10-CM

## 2012-11-07 MED ORDER — RISPERIDONE 1 MG PO TABS: 1.0000 mg | ORAL_TABLET | Freq: Every day | ORAL | Status: DC

## 2012-11-07 MED ORDER — SERTRALINE HCL 100 MG PO TABS: 200.0000 mg | ORAL_TABLET | Freq: Every day | ORAL | Status: DC
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
## 2012-11-08 ENCOUNTER — Ambulatory Visit (INDEPENDENT_AMBULATORY_CARE_PROVIDER_SITE_OTHER): Payer: No Typology Code available for payment source | Admitting: Licensed Clinical Social Worker

## 2012-11-08 DIAGNOSIS — F333 Major depressive disorder, recurrent, severe with psychotic symptoms: Secondary | ICD-10-CM

## 2012-11-08 NOTE — Progress Notes (Signed)
   THERAPIST PROGRESS NOTE  Session Time: 2:00 - 3:00  Participation Level: Active  Behavioral Response: CasualAlertEuthymic  Type of Therapy: Individual Therapy  Treatment Goals addressed: Anxiety  Interventions: Motivational Interviewing and Supportive  Summary: Melissa Lewis is a 62 y.o. female who presents with a report that she is doing better with the death of her parents.  She is less sad and feels that she has had good support with her family. When she does not like being alone, she goes to her brother and sister in law's house.  Her sister has also been there for her.  She gets bored and lonely at times.  She has looked into some volunteer work at the hospital.  They want a commitment of 6 months - the application made her feel like it was a job.  She is putting it on a back burner because she hs=as found out that her breast cancer has returned  I is ;at the very early stages but because she had breast cancer in 1994 they cannot do anymore radiation and they are going to do mastectomy.  She has decided to do a double mastectomy for her family has a strong history of breast cancer.  She is also going to have a reconstruction. She was upset at first but she knows it will be taken out and being so early she does not feel too anxious.  After her medical problem is dealt with she will look into more activities.  She does go to [employer] for programs there in the morning ; and  is on a diet using [employer].  She likes it and it is helping her feel better and she has lost about 5 pounds.  She will call for another appointment as needed so not scheduling one now.   Suicidal/Homicidal: No  Therapist Response: She is doing a lot better - more relaxed - feels more supported by her family  Plan: Return again as she feels the need.  Diagnosis: Axis I: Major Depression, Recurrent severe    Axis II: Deferred    Loic Hobin,JUDITH A, LCSW 11/08/2012

## 2012-12-14 NOTE — Telephone Encounter (Signed)
Erroneous encounter

## 2012-12-14 NOTE — Telephone Encounter (Signed)
acknowledged

## 2012-12-19 ENCOUNTER — Ambulatory Visit (HOSPITAL_COMMUNITY): Payer: Self-pay | Admitting: Psychiatry

## 2012-12-30 ENCOUNTER — Encounter (HOSPITAL_COMMUNITY): Payer: Self-pay | Admitting: Psychiatry

## 2012-12-30 ENCOUNTER — Ambulatory Visit (INDEPENDENT_AMBULATORY_CARE_PROVIDER_SITE_OTHER): Payer: No Typology Code available for payment source | Admitting: Psychiatry

## 2012-12-30 VITALS — BP 113/72 | HR 98 | Ht 59.5 in | Wt 164.0 lb

## 2012-12-30 DIAGNOSIS — F333 Major depressive disorder, recurrent, severe with psychotic symptoms: Secondary | ICD-10-CM

## 2012-12-30 MED ORDER — RISPERIDONE 1 MG PO TABS: 1.0000 mg | ORAL_TABLET | Freq: Every day | ORAL | Status: DC

## 2012-12-30 MED ORDER — SERTRALINE HCL 100 MG PO TABS: 200.0000 mg | ORAL_TABLET | Freq: Every day | ORAL | Status: DC

## 2012-12-30 MED ORDER — RISPERIDONE 0.25 MG PO TABS: 0.2500 mg | ORAL_TABLET | Freq: Two times a day (BID) | ORAL | Status: DC
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
## 2013-01-13 ENCOUNTER — Ambulatory Visit (INDEPENDENT_AMBULATORY_CARE_PROVIDER_SITE_OTHER): Payer: No Typology Code available for payment source | Admitting: Psychiatry

## 2013-01-13 ENCOUNTER — Encounter (HOSPITAL_COMMUNITY): Payer: Self-pay | Admitting: Psychiatry

## 2013-01-13 VITALS — BP 99/60 | HR 81 | Ht 59.0 in | Wt 164.0 lb

## 2013-01-13 DIAGNOSIS — F333 Major depressive disorder, recurrent, severe with psychotic symptoms: Secondary | ICD-10-CM

## 2013-01-13 DIAGNOSIS — F332 Major depressive disorder, recurrent severe without psychotic features: Secondary | ICD-10-CM

## 2013-01-13 MED ORDER — RISPERIDONE 0.25 MG PO TABS: ORAL_TABLET | ORAL | Status: DC
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
## 2013-02-13 ENCOUNTER — Other Ambulatory Visit (HOSPITAL_COMMUNITY): Payer: Self-pay | Admitting: Psychiatry

## 2013-03-13 ENCOUNTER — Encounter: Payer: Self-pay | Admitting: Physician Assistant

## 2013-03-13 ENCOUNTER — Ambulatory Visit (INDEPENDENT_AMBULATORY_CARE_PROVIDER_SITE_OTHER): Payer: BC Managed Care – PPO | Admitting: Physician Assistant

## 2013-03-13 VITALS — BP 125/76 | HR 81 | Ht 60.0 in | Wt 167.0 lb

## 2013-03-13 DIAGNOSIS — E039 Hypothyroidism, unspecified: Secondary | ICD-10-CM

## 2013-03-13 DIAGNOSIS — G4700 Insomnia, unspecified: Secondary | ICD-10-CM | POA: Insufficient documentation

## 2013-03-13 DIAGNOSIS — F323 Major depressive disorder, single episode, severe with psychotic features: Secondary | ICD-10-CM

## 2013-03-13 MED ORDER — ALPRAZOLAM 0.5 MG PO TABS: 0.5000 mg | ORAL_TABLET | Freq: Every evening | ORAL | Status: DC | PRN

## 2013-03-13 NOTE — Progress Notes (Signed)
  Subjective:    Patient ID: Melissa Lewis, female    DOB: 1951-07-03, 62 y.o.   MRN: [id number]  HPI Patient is a 62 yo female who presents to the clinic to establish care. PMH of MDD, Insomnia,hypothyroidism, Breast cancer.   Patient is chaging doctors because she was not able to get in with her doctor in a timely manner.  She is very sad and upset today. She cannot seem to get over all the stress of the year. Both of her parents have died within the year. She is divorced and alone. She is retired. She was dx with breast cancer and had a double mastectomy in march. She feels like she cannot take it any more. She has seen Dr. Demetrius Charity before downstairs and feels like he doesn't listen.she has been seen for counseling and feels like they don't give her anything to apply in her life. Dr. Demetrius Charity does not want to give her xanax and she states that is the only thing helping her sleep right now. She denies suicidal thoughts.  She has not had screening labs done recently.    Review of Systems  Constitutional: Positive for fatigue.  Endocrine: Negative.   Psychiatric/Behavioral: Positive for dysphoric mood.       Objective:   Physical Exam  Constitutional: She is oriented to person, place, and time. She appears well-developed and well-nourished.  Obesity.  HENT:  Head: Normocephalic and atraumatic.  Cardiovascular: Normal rate, regular rhythm and normal heart sounds.   Pulmonary/Chest: Effort normal and breath sounds normal. She has no wheezes.  Neurological: She is alert and oriented to person, place, and time.  Skin: Skin is warm and dry.  Psychiatric:  Very emotional. Crying during encounter with some flat affect.          Assessment & Plan:  Depression/Insomnia- did give 30 day supply of xanax. Discussed with pt I will not prescribe over psychiatrist so she needs to find one that will incorporate into therapy. Discussed to only use as needed and can become addicted. Discussed getting a hobby and  volunteering. Will refer to another psychiatrist because I think at this point she needs different medications for control. Encouraged physical activity.  Hypothyroidism- will recheck levels at next visit.      Pt aware needs to come back to discuss health maintence issues.   Spent 30 minutes with patient and greater than 50 percent spent counseling pt regarding depression.

## 2013-03-13 NOTE — Patient Instructions (Addendum)
Will refer to another psychiatrist.

## 2013-03-15 ENCOUNTER — Encounter: Payer: Self-pay | Admitting: Physician Assistant

## 2013-03-17 ENCOUNTER — Telehealth (HOSPITAL_COMMUNITY): Payer: Self-pay

## 2013-03-20 ENCOUNTER — Ambulatory Visit (INDEPENDENT_AMBULATORY_CARE_PROVIDER_SITE_OTHER): Payer: No Typology Code available for payment source | Admitting: Psychiatry

## 2013-03-20 ENCOUNTER — Encounter (HOSPITAL_COMMUNITY): Payer: Self-pay | Admitting: Psychiatry

## 2013-03-20 VITALS — BP 101/71 | HR 83 | Ht 59.0 in | Wt 168.0 lb

## 2013-03-20 DIAGNOSIS — F3342 Major depressive disorder, recurrent, in full remission: Secondary | ICD-10-CM

## 2013-03-20 DIAGNOSIS — F333 Major depressive disorder, recurrent, severe with psychotic symptoms: Secondary | ICD-10-CM

## 2013-03-20 MED ORDER — RISPERIDONE 0.25 MG PO TABS: ORAL_TABLET | ORAL | Status: DC

## 2013-03-20 MED ORDER — BUPROPION HCL ER (XL) 150 MG PO TB24: 150.0000 mg | ORAL_TABLET | ORAL | Status: DC

## 2013-03-20 MED ORDER — RISPERIDONE 1 MG PO TABS: 1.0000 mg | ORAL_TABLET | Freq: Every day | ORAL | Status: DC

## 2013-03-20 NOTE — Telephone Encounter (Signed)
Acknowledged.
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
## 2013-03-22 ENCOUNTER — Encounter (HOSPITAL_COMMUNITY): Payer: Self-pay | Admitting: Psychiatry

## 2013-03-22 ENCOUNTER — Telehealth (HOSPITAL_COMMUNITY): Payer: Self-pay | Admitting: Psychiatry

## 2013-03-23 ENCOUNTER — Telehealth (HOSPITAL_COMMUNITY): Payer: Self-pay

## 2013-03-23 NOTE — Telephone Encounter (Signed)
Called patient. She continues to feel depressed. She cannot afford IOP due to insurance. She is tolerating wellbutrin. She denies SI/HI/AVH.   PLAN: Will continue Wellbutrin. Will refer patient to therapy with Tasia Catchings.

## 2013-03-27 ENCOUNTER — Ambulatory Visit (INDEPENDENT_AMBULATORY_CARE_PROVIDER_SITE_OTHER): Payer: No Typology Code available for payment source | Admitting: Psychiatry

## 2013-03-27 ENCOUNTER — Encounter (HOSPITAL_COMMUNITY): Payer: Self-pay | Admitting: Psychiatry

## 2013-03-27 ENCOUNTER — Ambulatory Visit (HOSPITAL_COMMUNITY): Payer: Self-pay | Admitting: Behavioral Health

## 2013-03-27 VITALS — BP 110/70 | HR 77 | Ht 59.0 in | Wt 168.0 lb

## 2013-03-27 DIAGNOSIS — F333 Major depressive disorder, recurrent, severe with psychotic symptoms: Secondary | ICD-10-CM

## 2013-03-27 MED ORDER — FLUOXETINE HCL 10 MG PO CAPS: ORAL_CAPSULE | ORAL | Status: DC

## 2013-03-27 MED ORDER — SERTRALINE HCL 50 MG PO TABS: ORAL_TABLET | ORAL | Status: DC
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
## 2013-03-29 ENCOUNTER — Telehealth (HOSPITAL_COMMUNITY): Payer: Self-pay

## 2013-03-29 NOTE — Telephone Encounter (Signed)
The patient reports that she did not take prozac this morning patient. Asked her to take it tonight.  She denied any symptoms suggestive of serotonin syndrome. Asked patient to call in the morning.

## 2013-03-30 ENCOUNTER — Telehealth (HOSPITAL_COMMUNITY): Payer: Self-pay

## 2013-03-30 NOTE — Telephone Encounter (Signed)
CALLING AS REQUESTED BY YOU TO SPEAK TO TODAY

## 2013-03-30 NOTE — Telephone Encounter (Addendum)
The patient reports that she has been out of her house.  She states that she doesn't feel like going out. She reports that she is doing laundry room.  She reports that she feels weak but is pushing herself.  She denies any crying spells.  She has called the insurance company which will allow her to do IOP as it is in network.

## 2013-04-03 ENCOUNTER — Encounter (HOSPITAL_COMMUNITY): Payer: Self-pay | Admitting: Psychiatry

## 2013-04-03 ENCOUNTER — Ambulatory Visit (INDEPENDENT_AMBULATORY_CARE_PROVIDER_SITE_OTHER): Payer: No Typology Code available for payment source | Admitting: Psychiatry

## 2013-04-03 VITALS — BP 125/67 | HR 75 | Ht 59.0 in | Wt 168.0 lb

## 2013-04-03 DIAGNOSIS — F333 Major depressive disorder, recurrent, severe with psychotic symptoms: Secondary | ICD-10-CM

## 2013-04-03 DIAGNOSIS — F3342 Major depressive disorder, recurrent, in full remission: Secondary | ICD-10-CM

## 2013-04-03 MED ORDER — SERTRALINE HCL 50 MG PO TABS: ORAL_TABLET | ORAL | Status: DC

## 2013-04-03 MED ORDER — BUPROPION HCL ER (XL) 150 MG PO TB24: 300.0000 mg | ORAL_TABLET | ORAL | Status: DC

## 2013-04-03 MED ORDER — FLUOXETINE HCL 10 MG PO CAPS: ORAL_CAPSULE | ORAL | Status: DC
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
## 2013-04-10 ENCOUNTER — Encounter (HOSPITAL_COMMUNITY): Payer: Self-pay | Admitting: Psychiatry

## 2013-04-10 ENCOUNTER — Other Ambulatory Visit (HOSPITAL_COMMUNITY): Payer: No Typology Code available for payment source | Attending: Psychiatry | Admitting: Psychiatry

## 2013-04-10 ENCOUNTER — Ambulatory Visit (HOSPITAL_COMMUNITY): Payer: Self-pay | Admitting: Psychiatry

## 2013-04-10 DIAGNOSIS — F411 Generalized anxiety disorder: Secondary | ICD-10-CM

## 2013-04-10 DIAGNOSIS — F333 Major depressive disorder, recurrent, severe with psychotic symptoms: Secondary | ICD-10-CM | POA: Insufficient documentation

## 2013-04-10 DIAGNOSIS — F329 Major depressive disorder, single episode, unspecified: Secondary | ICD-10-CM
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
## 2013-04-10 NOTE — Progress Notes (Signed)
    Daily Group Progress Note  Program: IOP  Group Time: 9:00-10:30 am   Participation Level: Active  Behavioral Response: Appropriate  Type of Therapy:  Process Group  Summary of Progress: Today was Pts first day in the IOP program after being referred for the treatment of depression symptoms relating to the following stressors: 1) death of both her parents and 2) breast cancer. Pt became tearful when talking about both experiences and said she needs support to help her reduce her sadness.      Group Time: 10:30 am - 12:00 pm   Participation Level:  None  Behavioral Response: none  Type of Therapy: Meeting with PA  Summary of Progress: Pt met with Pt for her initial medication assessment.   Carman Ching, LCSW

## 2013-04-11 ENCOUNTER — Other Ambulatory Visit (HOSPITAL_COMMUNITY): Payer: No Typology Code available for payment source | Admitting: Psychiatry

## 2013-04-11 ENCOUNTER — Telehealth (HOSPITAL_COMMUNITY): Payer: Self-pay

## 2013-04-11 DIAGNOSIS — F329 Major depressive disorder, single episode, unspecified: Secondary | ICD-10-CM

## 2013-04-11 NOTE — Telephone Encounter (Signed)
BELIEVES HER MEDS SHE IS HAVING SIDE EFFECTS OF HEART RACING AND SHAKING PLEASE CALL

## 2013-04-11 NOTE — Progress Notes (Signed)
    Daily Group Progress Note  Program: IOP  Group Time: 9:00-10:30 am   Participation Level: Active  Behavioral Response: Appropriate  Type of Therapy:  Process Group  Summary of Progress: Pt presented with high depression symptoms and tearful affect. Pt processed feelings of sadness over the death of her parents this past January, her recent episode with breast cancer and described feeling "alone" and without "purpose". She identifies she needs to redefine her life after her parents passing, but feels overwhelmed with what to do with her time. She feels too much alone time at home is a trigger for depression symptoms.      Group Time: 10:30 am - 12:00 pm   Participation Level:  Active  Behavioral Response: Appropriate  Type of Therapy: Psycho-education Group  Summary of Progress: Pt learned a stress management breathing tool and how to use it to manage and reduce stress.  Carman Ching, LCSW

## 2013-04-11 NOTE — Telephone Encounter (Signed)
Opened in error

## 2013-04-11 NOTE — Telephone Encounter (Signed)
The patient reports that she her heart has been racing. She is now in IOP.  Plan: will decrease wellbutrin to 150 mg daily.

## 2013-04-12 ENCOUNTER — Encounter (HOSPITAL_COMMUNITY): Payer: Self-pay | Admitting: Psychiatry

## 2013-04-12 ENCOUNTER — Other Ambulatory Visit (HOSPITAL_COMMUNITY): Payer: No Typology Code available for payment source | Admitting: Psychiatry

## 2013-04-12 DIAGNOSIS — F333 Major depressive disorder, recurrent, severe with psychotic symptoms: Secondary | ICD-10-CM

## 2013-04-12 DIAGNOSIS — F411 Generalized anxiety disorder: Secondary | ICD-10-CM
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
## 2013-04-13 ENCOUNTER — Encounter (HOSPITAL_COMMUNITY): Payer: Self-pay | Admitting: Psychiatry

## 2013-04-13 ENCOUNTER — Other Ambulatory Visit (HOSPITAL_COMMUNITY): Payer: No Typology Code available for payment source | Admitting: Psychiatry

## 2013-04-13 DIAGNOSIS — F332 Major depressive disorder, recurrent severe without psychotic features: Secondary | ICD-10-CM

## 2013-04-13 DIAGNOSIS — F411 Generalized anxiety disorder: Secondary | ICD-10-CM

## 2013-04-13 NOTE — Progress Notes (Signed)
    Daily Group Progress Note  Program: IOP  Group Time: 9-10:45 am  Participation Level: Active  Behavioral Response: Appropriate and Sharing  Type of Therapy:  Process Group  Summary of Progress: The patient checked-in with a neutral position. She reported she had gone with her sister yesterday to have a pedicure. The patient explained that her feet were soaking for longer than she had anticipated because a technician was not available. She admitted she became very anxious. I wondered if she had thought about doing her [employer] breathing? The patient reported that had not occurred to her. When another member asked if she was still going to go on the camping trip with her family, the patient reported she had already packed some things so she was planning on it. Other members encouraged her to go on the trip and not stay home alone.   Group Time: 11-12 pm  Participation Level:  Active  Behavioral Response: Sharing  Type of Therapy: Graduation/Psychoeducation group  Summary of Progress: the patient had kind words of hope and encouragement for the graduating member. In the psycho-ed piece, the patient identified that she could not change the 'weather'. She pointed out a lot of people suffer from SAD in certain times of the year, especially winter time. What she could change was her attitude. She agreed that it is something she will have to practice. The patient made some good comments and responded well to this intervention.  Carman Ching, LCSW

## 2013-04-14 ENCOUNTER — Other Ambulatory Visit (HOSPITAL_COMMUNITY): Payer: No Typology Code available for payment source

## 2013-04-14 NOTE — Progress Notes (Unsigned)
    Daily Group Progress Note  Program: IOP  Group Time:  0900-1030  Participation Level: Minimal  Behavioral Response: Agitated  Type of Therapy:  Process Group  Summary of Progress:   Pt's mood flucuated.  Before process group, pt was laughing and quite talkative.  Halfway within group therapy, pt became quite agitated and withdrawn.  Whenever a peer checked on pt, she became very tearful and stated that she's not getting anything from group today."I came here today for coping skills."  Explained to pt the difference between process group vs a psycho-educational group.  Pt stated she was tired of hearing about the others job stress.     Group Time: 10:45-12n  Participation Level:  Minimal  Behavioral Response: Passive-Aggressive  Type of Therapy: Psycho-education Group  Summary of Progress:   Discussed the importance of setting treatment goals, assessing current resources, exploring new ones and having a safety plan and an aftercare plan in place.  [facility]

## 2013-04-18 ENCOUNTER — Other Ambulatory Visit (HOSPITAL_COMMUNITY): Payer: No Typology Code available for payment source | Attending: Psychiatry | Admitting: Psychiatry

## 2013-04-18 DIAGNOSIS — F330 Major depressive disorder, recurrent, mild: Secondary | ICD-10-CM | POA: Insufficient documentation

## 2013-04-18 DIAGNOSIS — F329 Major depressive disorder, single episode, unspecified: Secondary | ICD-10-CM

## 2013-04-18 NOTE — Progress Notes (Signed)
    Daily Group Progress Note  Program: IOP  Group Time: 9:00-10:30 am   Participation Level: Active  Behavioral Response: Appropriate  Type of Therapy:  Process Group  Summary of Progress: Pt stated she had high symptoms of depression. Pt said "I need to get out of the house more" and described feeling like her depression would improve if she could find ways to fill up her time. Pt attributes her depression to a lack of routine and lack of purpose each day. Pt said going to group helps her have structure to her day but she was already worrying about how she would fill her time after group. Pt presented with flat affect and tone of voice with minimal gross motor movements and appeared pained by her depression symptoms. Pt received support from others, identified and expressed feelings associated with her depression and processed feelings of "failure" for not being able to "get over her depression". Pt received depression education to challenge negative self-talk.      Group Time: 10:30 am - 12:00 pm   Participation Level:  Active  Behavioral Response: Appropriate  Type of Therapy: Psycho-education Group  Summary of Progress: Pt learned about the symptoms of clinical depression, the difference between chemical and situational depression and how to identify individual symptoms of depression to reduce the severity.   Carman Ching, LCSW

## 2013-04-19 ENCOUNTER — Other Ambulatory Visit (HOSPITAL_COMMUNITY): Payer: No Typology Code available for payment source | Admitting: Psychiatry

## 2013-04-19 DIAGNOSIS — F329 Major depressive disorder, single episode, unspecified: Secondary | ICD-10-CM

## 2013-04-20 ENCOUNTER — Other Ambulatory Visit (HOSPITAL_COMMUNITY): Payer: No Typology Code available for payment source | Admitting: Psychiatry

## 2013-04-20 ENCOUNTER — Encounter (HOSPITAL_COMMUNITY): Payer: Self-pay | Admitting: Psychiatry

## 2013-04-20 DIAGNOSIS — F329 Major depressive disorder, single episode, unspecified: Secondary | ICD-10-CM

## 2013-04-20 NOTE — Progress Notes (Signed)
    Daily Group Progress Note  Program: IOP  Group Time: 9:00-10:30 am   Participation Level: Active  Behavioral Response: Appropriate  Type of Therapy:  Process Group  Summary of Progress: Pt reports improved mood and had improved affect, increased engagement and talking. Pt received feedback from peers on the dramatic shift from the previous day. Pt said she contacted some volunteer organizations and is starting to plan for how she will fill up some of her time to reduce symptoms of depression.     Group Time: 10:30 am - 12:00 pm   Participation Level:  Active  Behavioral Response: Appropriate  Type of Therapy: Psycho-education Group  Summary of Progress: Pt learned about low self-esteem and how it begins and leads to behaviors later in life that impede wellness.  Carman Ching, LCSW
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
## 2013-04-21 ENCOUNTER — Other Ambulatory Visit (HOSPITAL_COMMUNITY): Payer: No Typology Code available for payment source | Admitting: Psychiatry

## 2013-04-21 DIAGNOSIS — F329 Major depressive disorder, single episode, unspecified: Secondary | ICD-10-CM

## 2013-04-21 DIAGNOSIS — F333 Major depressive disorder, recurrent, severe with psychotic symptoms: Secondary | ICD-10-CM

## 2013-04-21 MED ORDER — RISPERIDONE 0.25 MG PO TABS: ORAL_TABLET | ORAL | Status: DC

## 2013-04-21 MED ORDER — RISPERIDONE 1 MG PO TABS: 1.0000 mg | ORAL_TABLET | Freq: Every day | ORAL | Status: DC
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
## 2013-04-21 NOTE — Patient Instructions (Signed)
Patient completed MH-IOP today.  Will follow up with Dr. Laury Deep on 04-26-13 @ 2pm.  Encouraged support groups.  Plans to volunteer and exercise.

## 2013-04-21 NOTE — Progress Notes (Signed)
    Daily Group Progress Note  Program: IOP  Group Time: 9:00-10:30 am   Participation Level: Active  Behavioral Response: Appropriate  Type of Therapy:  Process Group  Summary of Progress: Pt participated in a goodbye ceremony to a member ending group today an practiced expressing feelings and having healthy closure.      Group Time: 10:30 am - 12:00 pm   Participation Level:  Active  Behavioral Response: Appropriate  Type of Therapy: Psycho-education Group  Summary of Progress:  Pt learned about how to use positive affirmations and reframing to challenge negative thinking that contributes to low self esteem.   Lamarkus Nebel E, LCSW 

## 2013-04-21 NOTE — Progress Notes (Signed)
Patient ID: Melissa Lewis, female   DOB: 17-Jan-1951, 61 y.o.   MRN: [id number] Discharge Note  Patient: Melissa Lewis DOB: [birth year]  Date of Admission: 04/10/2013 Date of Discharge: 04/21/2013  Reason for Admission: Referred by her psychiatrist, Dr. Geryl Rankins, for intensive outpatient group therapy treatment of her depression and anxiety.   Hospital course Melissa Lewis joined the group and initially presented with high depression symptoms and tearful affect. She processed feelings of sadness over the death of her parents this past January, her recent episode with breast cancer and described feeling "alone" and without "purpose". She identified the need to redefine her life after her parents passing, but feels overwhelmed with what to do with her time. After several group sessions, she complained she was not benefiting from the group, and she was bored.  Staff met individually with Melissa Lewis, and instructed her to be more assertive in expressing her needs.  She identified the need for a more structured life, and made arrangements to volunteer and take water aerobics classes.  At her discharge visit she stated she was feeling much improved, and expressed surprise at how beneficial the group had been for her. She endorses decreased anxiety and improved mood. She denied any suicidal or homicidal ideation. She denied any auditory or visual hallucinations.   Mental status at discharge Alert and oriented and in no acute distress. Good eye contact. Euthymic mood and bright affect. Well groomed and casually dressed. Cognitive functioning improved. Speech is clear and coherent at a regular rate and rhythm and normal volume. Insight and judgment are fair. No signs of psychosis or suicidality  Lab Results: No results found for this or any previous visit (from the past 48 hour(s)).   Discharge prescriptions: Prescription sent to pharmacy for Risperdal 1 mg and Risperdal 0.25 mg, both to be taken at bedtime, #30 each with one  refill each. Other discharge medications include, but no prescriptions given, Wellbutrin XL 150 mg daily, Prozac 40 mg daily, Xanax 0.5 mg at bedtime as needed for sleep. The patient also reports that she takes an over-the-counter melatonin supplement for sleep.  Axis Diagnosis:  Axis I: Maj. depressive disorder, recurrent, mild Axis II: Deferred Axis III: Hypothyroidism, acute bronchitis, fibromyalgia, osteoarthritis, environmental allergies, history of breast cancer, history of vertebral fracture Axis IV: Mild to moderate Axis V: 60  Level of Care: OP  Discharge destination: Home  Is patient on multiple antipsychotic therapies at discharge: No  Has Patient had three or more failed trials of antipsychotic monotherapy by history: No  Follow-up recommendations: Activity: As tolerated  Diet: Regular   Follow-up appointments: Patient will followup with her current outpatient psychiatrist, Dr. Geryl Rankins Wednesday, September 10 at 2 PM. Patient is referred to [facility] for counseling, or she may get a list of therapists from her insurance provider.   Comments:    The patient received suicide prevention pamphlet: Yes  [place] Spanish. Cathleen Yagi, MHS, PA-C

## 2013-04-21 NOTE — Progress Notes (Signed)
    Daily Group Progress Note  Program: IOP  Group Time: 9:00-10:30 am   Participation Level: Active  Behavioral Response: Appropriate  Type of Therapy:  Process Group  Summary of Progress: Pt provided an update on her continued improvement with depression and processed the grief she has over the loss of her parents.      Group Time: 10:30 am - 12:00 pm   Participation Level:  Active  Behavioral Response: Appropriate  Type of Therapy: Psycho-education Group  Summary of Progress: Pt learned about supportive mental health services available through the [employer] to access during and after their time in the IOP program for continued wellness.  Carman Ching, LCSW

## 2013-04-24 ENCOUNTER — Ambulatory Visit (INDEPENDENT_AMBULATORY_CARE_PROVIDER_SITE_OTHER): Payer: BC Managed Care – PPO | Admitting: Physician Assistant

## 2013-04-24 ENCOUNTER — Encounter: Payer: Self-pay | Admitting: Physician Assistant

## 2013-04-24 ENCOUNTER — Other Ambulatory Visit (HOSPITAL_COMMUNITY): Payer: No Typology Code available for payment source

## 2013-04-24 VITALS — BP 120/79 | HR 92 | Wt 167.0 lb

## 2013-04-24 DIAGNOSIS — I447 Left bundle-branch block, unspecified: Secondary | ICD-10-CM

## 2013-04-24 DIAGNOSIS — G2581 Restless legs syndrome: Secondary | ICD-10-CM | POA: Insufficient documentation

## 2013-04-24 DIAGNOSIS — R002 Palpitations: Secondary | ICD-10-CM

## 2013-04-24 DIAGNOSIS — E039 Hypothyroidism, unspecified: Secondary | ICD-10-CM

## 2013-04-24 DIAGNOSIS — Z131 Encounter for screening for diabetes mellitus: Secondary | ICD-10-CM

## 2013-04-24 DIAGNOSIS — Z13220 Encounter for screening for lipoid disorders: Secondary | ICD-10-CM

## 2013-04-24 DIAGNOSIS — R0602 Shortness of breath: Secondary | ICD-10-CM

## 2013-04-24 DIAGNOSIS — R011 Cardiac murmur, unspecified: Secondary | ICD-10-CM

## 2013-04-24 DIAGNOSIS — M797 Fibromyalgia: Secondary | ICD-10-CM | POA: Insufficient documentation

## 2013-04-24 LAB — CBC
HCT: 43.6 % (ref 36.0–46.0)
MCH: 29.3 pg (ref 26.0–34.0)
MCHC: 33.7 g/dL (ref 30.0–36.0)
MCV: 86.9 fL (ref 78.0–100.0)
Platelets: 273 10*3/uL (ref 150–400)
RDW: 14.9 % (ref 11.5–15.5)
WBC: 8.2 10*3/uL (ref 4.0–10.5)

## 2013-04-24 NOTE — Progress Notes (Signed)
  Subjective:    Patient ID: Melissa Lewis, female    DOB: [birth year], 62 y.o.   MRN: [id number]  HPI Patient presents to the clinic to discuss need for cardiologist. Pt has been having worsening shortness of breath. She is aware she has a LBBB that is not symptomatic. She has been easily winded for many years going up steps or walking long distances. Recently the shortness of breath happens when she does small task that she could do with ease. She denies any swelling, CP, numbness or tingling, waking up at night or having to sleep on pillows. She does feels like her heart easily races. When she had her mastectomy the doctor told her for the first time she had a heart murmur and to see a cardiologist.     Pt sees Dr. Demetrius Charity downstairs for Depression. She would like to also be referred to someone to talk to on a regular basis. She just finished a outpatient depression rehab on Friday and does feel much better.      Review of Systems     Objective:   Physical Exam  Constitutional: She is oriented to person, place, and time. She appears well-developed and well-nourished.  HENT:  Head: Normocephalic and atraumatic.  Neck: No JVD present. No tracheal deviation present.  Cardiovascular: Normal rate and regular rhythm.   I did not hear a murmur while sitting up. After laid pt down. I could hear a 2/6 systolic ejection murmur best on the left.   Pulmonary/Chest: Effort normal and breath sounds normal. No stridor. She has no wheezes.  Neurological: She is alert and oriented to person, place, and time.  Skin: Skin is warm and dry.  No lower extremity edema present.  Psychiatric:  Flat affect.          Assessment & Plan:  SOB/LBBB/heart murmur/palpitations-  I did in fact hear a murmur today. Could not hear until pt laid flat.  EKG- NSR at 85, no arrythmia, no ST elevation or depression, no qwaves. LBBB present. Possible atrial enlargement. No EKG to compare. Will get echo and send to cardiology  for full work up.  Did get labs to day to make thyroid or anemia is not causing pt to be symptomatic with SOB.   Screening labs were also order today.

## 2013-04-25 ENCOUNTER — Other Ambulatory Visit (HOSPITAL_COMMUNITY): Payer: No Typology Code available for payment source

## 2013-04-26 ENCOUNTER — Encounter (HOSPITAL_COMMUNITY): Payer: Self-pay | Admitting: Psychiatry

## 2013-04-26 ENCOUNTER — Ambulatory Visit (INDEPENDENT_AMBULATORY_CARE_PROVIDER_SITE_OTHER): Payer: No Typology Code available for payment source | Admitting: Psychiatry

## 2013-04-26 ENCOUNTER — Other Ambulatory Visit (HOSPITAL_COMMUNITY): Payer: No Typology Code available for payment source

## 2013-04-26 VITALS — BP 124/71 | HR 81 | Ht 59.0 in | Wt 167.0 lb

## 2013-04-26 DIAGNOSIS — F333 Major depressive disorder, recurrent, severe with psychotic symptoms: Secondary | ICD-10-CM

## 2013-04-26 MED ORDER — BUPROPION HCL ER (XL) 150 MG PO TB24: 150.0000 mg | ORAL_TABLET | ORAL | Status: DC

## 2013-04-26 MED ORDER — FLUOXETINE HCL 40 MG PO CAPS: 40.0000 mg | ORAL_CAPSULE | Freq: Every day | ORAL | Status: DC

## 2013-04-26 MED ORDER — RISPERIDONE 0.25 MG PO TABS: ORAL_TABLET | ORAL | Status: DC
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
## 2013-04-27 ENCOUNTER — Other Ambulatory Visit (HOSPITAL_COMMUNITY): Payer: No Typology Code available for payment source

## 2013-04-28 ENCOUNTER — Other Ambulatory Visit: Payer: Self-pay | Admitting: Physician Assistant

## 2013-04-28 ENCOUNTER — Other Ambulatory Visit (HOSPITAL_COMMUNITY): Payer: No Typology Code available for payment source

## 2013-05-01 ENCOUNTER — Telehealth: Payer: Self-pay | Admitting: *Deleted

## 2013-05-01 ENCOUNTER — Telehealth (HOSPITAL_COMMUNITY): Payer: Self-pay

## 2013-05-01 ENCOUNTER — Other Ambulatory Visit (HOSPITAL_COMMUNITY): Payer: No Typology Code available for payment source

## 2013-05-01 LAB — COMPLETE METABOLIC PANEL WITH GFR
ALT: 9 U/L (ref 0–35)
Albumin: 4.2 g/dL (ref 3.5–5.2)
CO2: 28 mEq/L (ref 19–32)
Calcium: 9.8 mg/dL (ref 8.4–10.5)
Chloride: 105 mEq/L (ref 96–112)
GFR, Est African American: 88 mL/min
GFR, Est Non African American: 76 mL/min
Glucose, Bld: 86 mg/dL (ref 70–99)
Potassium: 3.7 mEq/L (ref 3.5–5.3)
Sodium: 140 mEq/L (ref 135–145)
Total Protein: 7.2 g/dL (ref 6.0–8.3)

## 2013-05-01 LAB — LIPID PANEL
HDL: 49 mg/dL (ref >39)
LDL Cholesterol: 87 mg/dL (ref 0–99)

## 2013-05-01 NOTE — Telephone Encounter (Signed)
Pt calls and states she sees Dr. Baron Sane downstairs and due to her insurance she will not be able to see him anymore after her visit in a few weeks because they do not accept it.  She wants to know if you will write her prescriptions that he normally does and if there is another counselor elsewhere that would take her insurance. Barry Dienes, LPN

## 2013-05-01 NOTE — Telephone Encounter (Signed)
I can give bridge therapy. But since on Risperdal I think you need to see a psychiatrist. If you want to see who is in network can refer to them.

## 2013-05-02 ENCOUNTER — Encounter: Payer: Self-pay | Admitting: *Deleted

## 2013-05-02 NOTE — Telephone Encounter (Signed)
LMOM of instructions and to call when finds a psychiatrist in network and we can refer her. Barry Dienes, LPN

## 2013-05-02 NOTE — Telephone Encounter (Signed)
First attempt to call patient. Advised her I would call again in the morning.

## 2013-05-03 ENCOUNTER — Telehealth (HOSPITAL_COMMUNITY): Payer: Self-pay | Admitting: Psychiatry

## 2013-05-03 NOTE — Telephone Encounter (Signed)
Called patient. She cannot come due to insurance.

## 2013-05-05 NOTE — Telephone Encounter (Signed)
Erroneous encounter

## 2013-05-17 ENCOUNTER — Encounter: Payer: Self-pay | Admitting: Cardiology

## 2013-05-17 ENCOUNTER — Ambulatory Visit (INDEPENDENT_AMBULATORY_CARE_PROVIDER_SITE_OTHER): Payer: Medicare Other | Admitting: Cardiology

## 2013-05-17 VITALS — BP 114/72 | HR 89 | Ht 60.0 in | Wt 168.0 lb

## 2013-05-17 DIAGNOSIS — R0600 Dyspnea, unspecified: Secondary | ICD-10-CM

## 2013-05-17 DIAGNOSIS — R011 Cardiac murmur, unspecified: Secondary | ICD-10-CM

## 2013-05-17 DIAGNOSIS — R0609 Other forms of dyspnea: Secondary | ICD-10-CM
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
## 2013-05-17 NOTE — Assessment & Plan Note (Signed)
Probable ejection murmur. Schedule echocardiogram to further assess.

## 2013-05-17 NOTE — Assessment & Plan Note (Signed)
Etiology unclear. Patient noted to have a left bundle branch block. Plan Myoview to exclude ischemia.

## 2013-05-17 NOTE — Patient Instructions (Addendum)
Your physician recommends that you schedule a follow-up appointment in: AS NEEDED PENDING TEST RESULTS  Your physician has requested that you have a lexiscan myoview. For further information please visit [email or web address] Please follow instruction sheet, as given.   Your physician has requested that you have an echocardiogram. Echocardiography is a painless test that uses sound waves to create images of your heart. It provides your doctor with information about the size and shape of your heart and how well your heart's chambers and valves are working. This procedure takes approximately one hour. There are no restrictions for this procedure.

## 2013-05-18 ENCOUNTER — Ambulatory Visit (HOSPITAL_COMMUNITY): Payer: Self-pay | Admitting: Psychiatry

## 2013-05-29 ENCOUNTER — Ambulatory Visit: Payer: Self-pay | Admitting: Physician Assistant

## 2013-06-06 ENCOUNTER — Ambulatory Visit (HOSPITAL_COMMUNITY): Payer: Medicare Other | Attending: Cardiology | Admitting: Cardiology

## 2013-06-06 ENCOUNTER — Ambulatory Visit (HOSPITAL_BASED_OUTPATIENT_CLINIC_OR_DEPARTMENT_OTHER): Payer: Medicare Other | Admitting: Radiology

## 2013-06-06 VITALS — BP 136/80 | HR 107 | Ht 60.0 in | Wt 169.0 lb

## 2013-06-06 DIAGNOSIS — I447 Left bundle-branch block, unspecified: Secondary | ICD-10-CM | POA: Insufficient documentation

## 2013-06-06 DIAGNOSIS — C50919 Malignant neoplasm of unspecified site of unspecified female breast: Secondary | ICD-10-CM | POA: Insufficient documentation

## 2013-06-06 DIAGNOSIS — R0602 Shortness of breath: Secondary | ICD-10-CM | POA: Insufficient documentation

## 2013-06-06 DIAGNOSIS — R0600 Dyspnea, unspecified: Secondary | ICD-10-CM

## 2013-06-06 DIAGNOSIS — R0609 Other forms of dyspnea: Secondary | ICD-10-CM | POA: Insufficient documentation

## 2013-06-06 DIAGNOSIS — R0989 Other specified symptoms and signs involving the circulatory and respiratory systems: Secondary | ICD-10-CM | POA: Insufficient documentation

## 2013-06-06 DIAGNOSIS — F40240 Claustrophobia: Secondary | ICD-10-CM

## 2013-06-06 DIAGNOSIS — I079 Rheumatic tricuspid valve disease, unspecified: Secondary | ICD-10-CM | POA: Insufficient documentation

## 2013-06-06 DIAGNOSIS — I059 Rheumatic mitral valve disease, unspecified: Secondary | ICD-10-CM | POA: Insufficient documentation

## 2013-06-06 DIAGNOSIS — R011 Cardiac murmur, unspecified: Secondary | ICD-10-CM | POA: Insufficient documentation

## 2013-06-06 MED ORDER — DIAZEPAM 5 MG PO TABS
5.0000 mg | ORAL_TABLET | Freq: Once | ORAL | Status: AC
  Administered 2013-06-06: 5 mg via ORAL

## 2013-06-06 MED ORDER — ADENOSINE (DIAGNOSTIC) 3 MG/ML IV SOLN
0.5600 mg/kg | Freq: Once | INTRAVENOUS | Status: AC
  Administered 2013-06-06: 42.9 mg via INTRAVENOUS

## 2013-06-06 MED ORDER — TECHNETIUM TC 99M SESTAMIBI GENERIC - CARDIOLITE
11.0000 | Freq: Once | INTRAVENOUS | Status: AC | PRN
  Administered 2013-06-06: 11 via INTRAVENOUS

## 2013-06-06 MED ORDER — TECHNETIUM TC 99M SESTAMIBI GENERIC - CARDIOLITE
33.0000 | Freq: Once | INTRAVENOUS | Status: AC | PRN
  Administered 2013-06-06: 33 via INTRAVENOUS

## 2013-06-06 NOTE — Progress Notes (Signed)
Echo performed. 

## 2013-06-06 NOTE — Progress Notes (Signed)
[facility] 3 NUCLEAR MED [address] [place], [place] [address] [phone]    Cardiology Nuclear Med Study  Melissa Lewis is a 62 y.o. female     MRN : [id number]     DOB: March 23, 1951  Procedure Date: 06/06/2013  Nuclear Med Background Indication for Stress Test:  Evaluation for Ischemia History:  GXT;Echo:pending Cardiac Risk Factors: LBBB  Symptoms:  DOE and SOB   Nuclear Pre-Procedure Caffeine/Decaff Intake:  None NPO After: 10:30 am   Lungs:  clear O2 Sat: 97% on room air. IV 0.9% NS with Angio Cath:  22g  IV Site: R Hand  IV Started by:  Cathlyn Parsons, RN  Chest Size (in):  36 Cup Size: B  Height: 5' (1.524 m)  Weight:  169 lb (76.658 kg)  BMI:  Body mass index is 33.01 kg/(m^2). Tech Comments:  Patient crying due to claustrophobic when attempting 2nd images. Valium 5mg  po given at 1453 per orders by Dr. Marca Ancona. The patient has a driver./[name], Charity fundraiser.    Nuclear Med Study 1 or 2 day study: 1 day  Stress Test Type:  Adenosine  Reading MD: Olga Millers, MD  Order Authorizing Provider:  Ripley Fraise  Resting Radionuclide: Technetium 30m Sestamibi  Resting Radionuclide Dose: 11.0 mCi   Stress Radionuclide:  Technetium 59m Sestamibi  Stress Radionuclide Dose: 33.0 mCi           Stress Protocol Rest HR: 107 Stress HR: 127  Rest BP: 133/80 Stress BP: 161/81  Exercise Time (min): n/a METS: n/a   Predicted Max HR: 159 bpm % Max HR: 79.87 bpm Rate Pressure Product: 21308   Dose of Adenosine (mg):  43 Dose of Lexiscan: n/a mg  Dose of Atropine (mg): n/a Dose of Dobutamine: n/a mcg/kg/min (at max HR)  Stress Test Technologist: Nelson Chimes, BS-ES  Nuclear Technologist:  Domenic Polite, CNMT     Rest Procedure:  Myocardial perfusion imaging was performed at rest 45 minutes following the intravenous administration of Technetium 8m Sestamibi. Rest ECG: NSR-LBBB  Stress Procedure:  The patient received IV adenosine at 140  mcg/kg/min for 4 minutes.  Technetium 24m Sestamibi was injected at the 2 minute mark and quantitative spect images were obtained after a 45 minute delay. During the infusion, pt stated her arms and chest felt heavy and she was SOB.  This resolved in recovery.  Stress ECG: Uninteretable due to baseline LBBB  QPS Raw Data Images:  Acquisition technically good; normal left ventricular size. Stress Images:  There is decreased uptake in the anterior wall and apex. Rest Images:  There is decreased uptake in the anterior wall and apex, less prominent compared to stress images. Subtraction (SDS):  These findings are consistent with prior infarct and mild peri-infarct ischemia. Transient Ischemic Dilatation (Normal <1.22):  NA Lung/Heart Ratio (Normal <0.45):  0.18  Quantitative Gated Spect Images QGS EDV:  NA  QGS ESV:  NA   Impression Exercise Capacity:  Adenosine study with no exercise. BP Response:  Normal blood pressure response. Clinical Symptoms:  There is dyspnea and chest heaviness. ECG Impression:  Baseline:  LBBB.  EKG uninterpretable due to LBBB at rest and stress. Comparison with Prior Nuclear Study: No previous nuclear study performed  Overall Impression:  Intermediate risk stress nuclear study with a large, severe intensity, partially reversible distal anterior and apical defect consistent with prior infarct and mild peri-infarct ischemia.  LV Ejection Fraction: Study not gated.  LV Wall Motion: NOT GATED  Kirk Ruths

## 2013-06-14 ENCOUNTER — Encounter: Payer: Self-pay | Admitting: Cardiology

## 2013-06-14 ENCOUNTER — Other Ambulatory Visit: Payer: Self-pay | Admitting: Cardiology

## 2013-06-14 ENCOUNTER — Encounter: Payer: Self-pay | Admitting: *Deleted

## 2013-06-14 ENCOUNTER — Ambulatory Visit (INDEPENDENT_AMBULATORY_CARE_PROVIDER_SITE_OTHER): Payer: Medicare Other | Admitting: Cardiology

## 2013-06-14 VITALS — BP 130/84 | HR 86 | Ht 60.0 in | Wt 168.0 lb

## 2013-06-14 DIAGNOSIS — R011 Cardiac murmur, unspecified: Secondary | ICD-10-CM

## 2013-06-14 DIAGNOSIS — R0602 Shortness of breath: Secondary | ICD-10-CM

## 2013-06-14 DIAGNOSIS — R9439 Abnormal result of other cardiovascular function study: Secondary | ICD-10-CM | POA: Insufficient documentation

## 2013-06-14 DIAGNOSIS — R9430 Abnormal result of cardiovascular function study, unspecified: Secondary | ICD-10-CM
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
## 2013-06-14 NOTE — Assessment & Plan Note (Signed)
Plan cardiac catheterization to exclude anginal equivalent.

## 2013-06-14 NOTE — Assessment & Plan Note (Signed)
Patient's nuclear study shows a significant defect in the anterior and apical wall. There is partial reversibility. Certainly this may be related to patient's left bundle branch block but given ischemia and persistent dyspnea I would favor definitive evaluation with cardiac catheterization. The risks and benefits were discussed and the patient agrees to proceed. Begin aspirin 81 mg daily prior to the procedure.

## 2013-06-14 NOTE — Assessment & Plan Note (Signed)
No significant Doppler abnormality noted on echocardiogram.

## 2013-06-14 NOTE — Patient Instructions (Signed)

## 2013-06-15 ENCOUNTER — Encounter (HOSPITAL_COMMUNITY): Payer: Self-pay | Admitting: Pharmacist

## 2013-06-15 LAB — CBC
MCHC: 33.6 g/dL (ref 30.0–36.0)
MCV: 89.6 fL (ref 78.0–100.0)
Platelets: 232 10*3/uL (ref 150–400)
RBC: 4.71 MIL/uL (ref 3.87–5.11)
RDW: 13.5 % (ref 11.5–15.5)
WBC: 6.8 10*3/uL (ref 4.0–10.5)

## 2013-06-15 LAB — BASIC METABOLIC PANEL WITH GFR
BUN: 11 mg/dL (ref 6–23)
Chloride: 104 mEq/L (ref 96–112)
GFR, Est African American: >89 mL/min
GFR, Est Non African American: 80 mL/min
Potassium: 4 mEq/L (ref 3.5–5.3)
Sodium: 138 mEq/L (ref 135–145)

## 2013-06-15 LAB — PROTIME-INR
INR: 0.98 (ref <1.50)
Prothrombin Time: 13 seconds (ref 11.6–15.2)

## 2013-06-21 ENCOUNTER — Encounter (HOSPITAL_COMMUNITY)
Admission: RE | Disposition: A | Discharge status: Home / Self Care | Payer: Self-pay | Source: Ambulatory Visit | Attending: Cardiovascular Disease

## 2013-06-21 ENCOUNTER — Ambulatory Visit (HOSPITAL_COMMUNITY)
Admission: RE | Admit: 2013-06-21 | Discharge: 2013-06-21 | Disposition: A | Discharge status: Home / Self Care | Payer: Medicare Other | Source: Ambulatory Visit | Attending: Cardiovascular Disease | Admitting: Cardiovascular Disease

## 2013-06-21 DIAGNOSIS — R0609 Other forms of dyspnea: Secondary | ICD-10-CM | POA: Insufficient documentation

## 2013-06-21 DIAGNOSIS — I447 Left bundle-branch block, unspecified: Secondary | ICD-10-CM | POA: Insufficient documentation

## 2013-06-21 DIAGNOSIS — F3289 Other specified depressive episodes: Secondary | ICD-10-CM | POA: Insufficient documentation

## 2013-06-21 DIAGNOSIS — F329 Major depressive disorder, single episode, unspecified: Secondary | ICD-10-CM | POA: Insufficient documentation

## 2013-06-21 DIAGNOSIS — R9430 Abnormal result of cardiovascular function study, unspecified: Secondary | ICD-10-CM

## 2013-06-21 DIAGNOSIS — R0602 Shortness of breath: Secondary | ICD-10-CM

## 2013-06-21 DIAGNOSIS — R0989 Other specified symptoms and signs involving the circulatory and respiratory systems: Secondary | ICD-10-CM | POA: Insufficient documentation

## 2013-06-21 DIAGNOSIS — E039 Hypothyroidism, unspecified: Secondary | ICD-10-CM | POA: Insufficient documentation

## 2013-06-21 DIAGNOSIS — R9439 Abnormal result of other cardiovascular function study: Secondary | ICD-10-CM | POA: Insufficient documentation

## 2013-06-21 DIAGNOSIS — F411 Generalized anxiety disorder: Secondary | ICD-10-CM | POA: Insufficient documentation

## 2013-06-21 HISTORY — PX: LEFT HEART CATHETERIZATION WITH CORONARY ANGIOGRAM: SHX5451

## 2013-06-21 SURGERY — LEFT HEART CATHETERIZATION WITH CORONARY ANGIOGRAM
Anesthesia: LOCAL

## 2013-06-21 MED ORDER — SODIUM CHLORIDE 0.9 % IJ SOLN: 3.0000 mL | INTRAMUSCULAR | Status: DC | PRN

## 2013-06-21 MED ORDER — ACETAMINOPHEN 325 MG PO TABS: 650.0000 mg | ORAL_TABLET | ORAL | Status: DC | PRN

## 2013-06-21 MED ORDER — MIDAZOLAM HCL 2 MG/2ML IJ SOLN
INTRAMUSCULAR | Status: AC
  Filled 2013-06-21: qty 2

## 2013-06-21 MED ORDER — SODIUM CHLORIDE 0.9 % IJ SOLN: 3.0000 mL | Freq: Two times a day (BID) | INTRAMUSCULAR | Status: DC

## 2013-06-21 MED ORDER — HEPARIN (PORCINE) IN NACL 2-0.9 UNIT/ML-% IJ SOLN
INTRAMUSCULAR | Status: AC
  Filled 2013-06-21: qty 1000

## 2013-06-21 MED ORDER — NITROGLYCERIN 0.2 MG/ML ON CALL CATH LAB
INTRAVENOUS | Status: AC
  Filled 2013-06-21: qty 1

## 2013-06-21 MED ORDER — DIAZEPAM 2 MG PO TABS
2.0000 mg | ORAL_TABLET | ORAL | Status: DC
  Filled 2013-06-21: qty 1

## 2013-06-21 MED ORDER — SODIUM CHLORIDE 0.9 % IV SOLN
1.0000 mL/kg/h | INTRAVENOUS | Status: DC
  Administered 2013-06-21: 1 mL/kg/h via INTRAVENOUS

## 2013-06-21 MED ORDER — ASPIRIN 81 MG PO CHEW
CHEWABLE_TABLET | ORAL | Status: AC
  Filled 2013-06-21: qty 1

## 2013-06-21 MED ORDER — VERAPAMIL HCL 2.5 MG/ML IV SOLN
INTRAVENOUS | Status: AC
  Filled 2013-06-21: qty 2

## 2013-06-21 MED ORDER — ASPIRIN 81 MG PO CHEW
81.0000 mg | CHEWABLE_TABLET | ORAL | Status: AC
  Administered 2013-06-21: 81 mg via ORAL

## 2013-06-21 MED ORDER — DIAZEPAM 5 MG PO TABS
5.0000 mg | ORAL_TABLET | Freq: Once | ORAL | Status: AC
  Administered 2013-06-21: 5 mg via ORAL
  Filled 2013-06-21: qty 1

## 2013-06-21 MED ORDER — SODIUM CHLORIDE 0.9 % IV SOLN: 250.0000 mL | INTRAVENOUS | Status: DC | PRN

## 2013-06-21 MED ORDER — SODIUM CHLORIDE 0.9 % IV SOLN: INTRAVENOUS | Status: DC

## 2013-06-21 MED ORDER — ALPRAZOLAM 0.25 MG PO TABS
0.5000 mg | ORAL_TABLET | Freq: Once | ORAL | Status: AC
  Administered 2013-06-21: 0.5 mg via ORAL
  Filled 2013-06-21: qty 2

## 2013-06-21 MED ORDER — FENTANYL CITRATE 0.05 MG/ML IJ SOLN
INTRAMUSCULAR | Status: AC
  Filled 2013-06-21: qty 2

## 2013-06-21 MED ORDER — ALPRAZOLAM 0.5 MG PO TABS: 0.5000 mg | ORAL_TABLET | Freq: Once | ORAL | Status: DC

## 2013-06-21 MED ORDER — LIDOCAINE HCL (PF) 1 % IJ SOLN
INTRAMUSCULAR | Status: AC
  Filled 2013-06-21: qty 30
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
## 2013-06-21 NOTE — CV Procedure (Signed)
   Cardiac Catheterization Procedure Note  Name: Melissa Lewis MRN: [id number] DOB: [birth year]  Procedure: Left Heart Cath, Selective Coronary Angiography, LV angiography  Indication: Dyspnea with abnormal stress test.  Medications:  Sedation:  2 mg IV Versed, 50 mcg IV Fentanyl  Contrast:  70 ml Omnipaque   Procedural Details: The right wrist was prepped, draped, and anesthetized with 1% lidocaine. Using the modified Seldinger technique, a 5 French sheath was introduced into the right radial artery. 3 mg of verapamil was administered through the sheath, weight-based unfractionated heparin was administered intravenously. A Jackie catheter was used for selective coronary angiography. A JL 3.5 catheter was used to image the LAD. A pigtail catheter was used for left ventriculography. Catheter exchanges were performed over an exchange length guidewire. There were no immediate procedural complications. A TR band was used for radial hemostasis at the completion of the procedure.  The patient was transferred to the post catheterization recovery area for further monitoring.  Procedural Findings:  Hemodynamics: AO:  158/86  mmHg LV:  159/13    mmHg LVEDP: 13  mmHg  Coronary angiography: Coronary dominance: Left   Left Main:  Absent. The LAD and left circumflex has 2 separate ostium  Left Anterior Descending (LAD):  This was imaged nonselectively. The vessel is normal in size with no significant disease.  Circumflex (LCx):  Large in size and dominant. The vessel is normal.  1st obtuse marginal:  Small in size with no significant disease.  2nd obtuse marginal:  Medium in size with no significant disease.  3rd obtuse marginal:  Large in size and normal.   AV groove continuation segment: Large in size and normal. The left PL branches are normal.  Right Coronary Artery: Small in size and nondominant. The vessel has no significant disease.  Left ventriculography: Left ventricular systolic  function is an low normal, LVEF is estimated at 50-55 %, there is no significant mitral regurgitation   Final Conclusions:   1. Normal coronary arteries 2  Low normal LV systolic function with an EF of 50-55%.  Recommendations:  Abnormal stress test is likely due to left bundle branch block.  Lorine Bears MD, [facility] 06/21/2013, 10:16 AM

## 2013-06-21 NOTE — Interval H&P Note (Signed)
Cath Lab Visit (complete for each Cath Lab visit)  Clinical Evaluation Leading to the Procedure:   ACS: no  Non-ACS:    Anginal Classification: CCS III  Anti-ischemic medical therapy: Minimal Therapy (1 class of medications)  Non-Invasive Test Results: Intermediate-risk stress test findings: cardiac mortality 1-3%/year  Prior CABG: No previous CABG      History and Physical Interval Note:  06/21/2013 9:47 AM  Melissa Lewis  has presented today for surgery, with the diagnosis of cp  The various methods of treatment have been discussed with the patient and family. After consideration of risks, benefits and other options for treatment, the patient has consented to  Procedure(s): LEFT HEART CATHETERIZATION WITH CORONARY ANGIOGRAM (N/A) as a surgical intervention .  The patient's history has been reviewed, patient examined, no change in status, stable for surgery.  I have reviewed the patient's chart and labs.  Questions were answered to the patient's satisfaction.     Lorine Bears

## 2013-07-19 ENCOUNTER — Encounter: Payer: Self-pay | Admitting: Physician Assistant

## 2013-07-19 ENCOUNTER — Ambulatory Visit (INDEPENDENT_AMBULATORY_CARE_PROVIDER_SITE_OTHER): Payer: Medicare Other | Admitting: Physician Assistant

## 2013-07-19 VITALS — BP 125/68 | HR 101 | Wt 164.0 lb

## 2013-07-19 DIAGNOSIS — R630 Anorexia: Secondary | ICD-10-CM

## 2013-07-19 DIAGNOSIS — G251 Drug-induced tremor: Secondary | ICD-10-CM

## 2013-07-19 DIAGNOSIS — G250 Essential tremor: Secondary | ICD-10-CM

## 2013-07-19 DIAGNOSIS — F333 Major depressive disorder, recurrent, severe with psychotic symptoms: Secondary | ICD-10-CM

## 2013-07-19 MED ORDER — HYDROXYZINE HCL 10 MG PO TABS: 10.0000 mg | ORAL_TABLET | Freq: Three times a day (TID) | ORAL | Status: DC | PRN

## 2013-07-19 NOTE — Patient Instructions (Addendum)
Stop prozac. Continue to use Xanax twice a day. Ensure for appetite. miralax 1-2 capfuls. Keep psychiatric appt.

## 2013-07-21 NOTE — Progress Notes (Signed)
   Subjective:    Patient ID: Melissa Lewis, female    DOB: 21-Mar-1951, 62 y.o.   MRN: [id number]  HPI Patient presents to the clinic with a continual tremor. Pt was recently seen by her oncologist to make sure that her breast cancer had not returned. There were no signs of cancer. Tremors started about 2 months ago. They have not worsened but are constant. She is very anxious and crying all the time. She stated "she feels like she is going to explode". She lives by herself and is very lonely. She denies any plans of suicidal but she does feel like the world is better off without her. She has no appetitie. she does not have anything that brings her pleasure. She recently switched psychiatrist due to insurance and lots of medication changes were made as well as addition of prozac. Nothing else has changed. She denies any pain, numbness and tingling. She takes xanax twice a day and does not seem to help with tremor but does help make her sleepy.     Review of Systems     Objective:   Physical Exam  Constitutional: She is oriented to person, place, and time. She appears well-developed and well-nourished.  HENT:  Head: Normocephalic and atraumatic.  Cardiovascular: Regular rhythm and normal heart sounds.   Tachycardia at 101.   Pulmonary/Chest: Effort normal and breath sounds normal.  Neurological: She is alert and oriented to person, place, and time.  Tremor with bilateral hands and legs. Tremor is better when writting and resisting pressure. Strength of hands and legs 5/5. Reflexes knees/antecubital 2+ and symmetric  Skin: Skin is warm and dry.  Psychiatric:  Crying, shaky, seems emotionally unstable.           Assessment & Plan:  Tremor/possible medication reaction/depression-PHQ-9 was 21. It seems suspicious to me that tremor started at the same time medication changes were made and prozac was added. Pt has appt with psychiatrist tomorrow. I suggested pt to stop prozac and f/u with pysch.  I do not feel like pt is at risk to harm herself. Follow up as needed. If not improving pt can follow back in clinic. Ensure for nutrition.    Spent 30 minutes with patient and greater than 50 percent of visit spent counseling pt regarding depression.

## 2013-09-27 ENCOUNTER — Telehealth: Payer: Self-pay | Admitting: Physician Assistant

## 2013-09-27 NOTE — Telephone Encounter (Signed)
He is actually leaving in April so we are not referring to him. Is there anyone else you would like to see. They are supposed to be getting another provider there soon.

## 2013-09-27 NOTE — Telephone Encounter (Signed)
Patient called and is requesting a referral for Dr. Mamie Nick with behavioral Health. Patient stated he insurance has changed and is now able to go see him. Please call to adv about referral-. [phone] pt's num

## 2013-09-28 ENCOUNTER — Telehealth: Payer: Self-pay | Admitting: *Deleted

## 2013-09-28 NOTE — Telephone Encounter (Signed)
Pt called today asking for a referral for PT sent to [facility] here in [place].  She states that she was going there before for lymphedema & her oncologist suggested she start back.

## 2013-09-29 ENCOUNTER — Other Ambulatory Visit: Payer: Self-pay | Admitting: Physician Assistant

## 2013-09-29 DIAGNOSIS — E8989 Other postprocedural endocrine and metabolic complications and disorders: Secondary | ICD-10-CM

## 2013-09-29 DIAGNOSIS — I890 Lymphedema, not elsewhere classified: Principal | ICD-10-CM

## 2013-09-29 NOTE — Telephone Encounter (Signed)
Ok will do. Which arm do you have lymphedema in?

## 2013-09-29 NOTE — Telephone Encounter (Signed)
Pt notified of referral  

## 2013-09-29 NOTE — Telephone Encounter (Signed)
Sent!

## 2013-09-29 NOTE — Telephone Encounter (Signed)
Lymphedema is under her left arm

## 2013-10-05 ENCOUNTER — Telehealth (HOSPITAL_COMMUNITY): Payer: Self-pay

## 2013-10-05 NOTE — Telephone Encounter (Signed)
Patient called. She is seeing Dr. Ruthann Cancer. She has refills of her medications until Jan 13, 2014 but reports she will not be able to see her provider at that time due to insurance issues. Will send to office staff to have patient scheduled in May either here or in [place] if needed.

## 2013-10-06 ENCOUNTER — Telehealth: Payer: Self-pay | Admitting: *Deleted

## 2013-10-06 DIAGNOSIS — E039 Hypothyroidism, unspecified: Secondary | ICD-10-CM

## 2013-10-06 NOTE — Telephone Encounter (Signed)
Pt calls & asks if it's time to have her TSH checked.  Last time was sept last year.  She seems to think she has it checked every 3 months but I couldn't find a note anywhere as to when she was to be rechecked.  Please advise

## 2013-10-09 NOTE — Telephone Encounter (Signed)
Labs ordered, faxed to lab, & pt notified.

## 2013-10-09 NOTE — Telephone Encounter (Signed)
Ok to check TSH, T4, T3.

## 2013-10-12 LAB — T3, FREE: T3, Free: 2.4 pg/mL (ref 2.3–4.2)

## 2013-10-12 LAB — TSH: TSH: 1.505 u[IU]/mL (ref 0.350–4.500)

## 2013-10-12 LAB — T4, FREE: Free T4: 1.2 ng/dL (ref 0.80–1.80)

## 2013-11-07 ENCOUNTER — Telehealth (HOSPITAL_COMMUNITY): Payer: Self-pay | Admitting: Psychiatry

## 2013-11-08 NOTE — Telephone Encounter (Addendum)
Called patient.  She is currently seeing Dr. Celesta Aver for psychiatry and prescribed medications by him (zoloft 200 mg and Risperidone 0.5 mg.) She states she has an appointment in May with Dr. Ruthann Cancer but is not sure if she will go back.   Patient informed that April 15th, 2015 would be my last day at this clinic and that I advised her to keep her appointment with her psychiatrist as she is stable on her medications with no SI/HI/AVH or medication side effects.  She was informed that another psychiatrist should be here in May 2015 and to call back at that time if she needed to establish care here.

## 2014-01-05 ENCOUNTER — Encounter (INDEPENDENT_AMBULATORY_CARE_PROVIDER_SITE_OTHER): Payer: Self-pay

## 2014-01-05 ENCOUNTER — Ambulatory Visit (INDEPENDENT_AMBULATORY_CARE_PROVIDER_SITE_OTHER): Payer: Medicare HMO | Admitting: Psychiatry

## 2014-01-05 DIAGNOSIS — F333 Major depressive disorder, recurrent, severe with psychotic symptoms: Secondary | ICD-10-CM | POA: Diagnosis not present

## 2014-01-05 MED ORDER — ALPRAZOLAM 0.5 MG PO TABS: 0.5000 mg | ORAL_TABLET | Freq: Every day | ORAL | Status: DC

## 2014-01-05 MED ORDER — RISPERIDONE 0.5 MG PO TABS: 0.5000 mg | ORAL_TABLET | Freq: Every day | ORAL | Status: DC

## 2014-01-05 MED ORDER — SERTRALINE HCL 100 MG PO TABS: 200.0000 mg | ORAL_TABLET | Freq: Every day | ORAL | Status: DC
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
## 2014-03-16 ENCOUNTER — Encounter (HOSPITAL_COMMUNITY): Payer: Self-pay

## 2014-03-26 ENCOUNTER — Telehealth (HOSPITAL_COMMUNITY): Payer: Self-pay

## 2014-03-27 MED ORDER — SERTRALINE HCL 100 MG PO TABS: 200.0000 mg | ORAL_TABLET | Freq: Every day | ORAL | Status: DC

## 2014-03-27 NOTE — Telephone Encounter (Signed)
Zoloft refilled

## 2014-03-29 ENCOUNTER — Encounter (INDEPENDENT_AMBULATORY_CARE_PROVIDER_SITE_OTHER): Payer: Self-pay

## 2014-03-29 ENCOUNTER — Ambulatory Visit (INDEPENDENT_AMBULATORY_CARE_PROVIDER_SITE_OTHER): Payer: Commercial Managed Care - HMO | Admitting: Psychiatry

## 2014-03-29 VITALS — BP 99/76 | HR 85 | Ht 60.0 in | Wt 160.0 lb

## 2014-03-29 DIAGNOSIS — F411 Generalized anxiety disorder: Secondary | ICD-10-CM

## 2014-03-29 DIAGNOSIS — F332 Major depressive disorder, recurrent severe without psychotic features: Secondary | ICD-10-CM

## 2014-03-29 DIAGNOSIS — F333 Major depressive disorder, recurrent, severe with psychotic symptoms: Secondary | ICD-10-CM

## 2014-03-29 MED ORDER — RISPERIDONE 0.5 MG PO TABS: 0.5000 mg | ORAL_TABLET | Freq: Every day | ORAL | Status: DC

## 2014-03-29 MED ORDER — ALPRAZOLAM 0.5 MG PO TABS: 0.5000 mg | ORAL_TABLET | Freq: Every day | ORAL | Status: AC

## 2014-03-29 MED ORDER — SERTRALINE HCL 100 MG PO TABS: 200.0000 mg | ORAL_TABLET | Freq: Every day | ORAL | Status: DC
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
## 2014-04-02 ENCOUNTER — Ambulatory Visit (HOSPITAL_COMMUNITY): Payer: Medicare Other | Admitting: Psychiatry

## 2014-06-22 ENCOUNTER — Other Ambulatory Visit (HOSPITAL_COMMUNITY): Payer: Self-pay | Admitting: Psychiatry

## 2014-06-22 DIAGNOSIS — F332 Major depressive disorder, recurrent severe without psychotic features: Secondary | ICD-10-CM

## 2014-06-22 NOTE — Telephone Encounter (Signed)
Pt called for a refill for Risperidone 0.5mg . Called pharmacy to authorize 1 refill for Risperidone 0.5mg  per Dr. De Nurse. Pt last appt was 8/13 and next appt is 11/13. Called and informed pt prescription will be ready for pickup on 11/6.

## 2014-06-29 ENCOUNTER — Ambulatory Visit (HOSPITAL_COMMUNITY): Payer: Self-pay | Admitting: Psychiatry

## 2014-07-19 ENCOUNTER — Ambulatory Visit (INDEPENDENT_AMBULATORY_CARE_PROVIDER_SITE_OTHER): Payer: Commercial Managed Care - HMO | Admitting: Psychiatry

## 2014-07-19 ENCOUNTER — Encounter (HOSPITAL_COMMUNITY): Payer: Self-pay | Admitting: Psychiatry

## 2014-07-19 VITALS — HR 80 | Ht 60.0 in | Wt 165.0 lb

## 2014-07-19 DIAGNOSIS — F411 Generalized anxiety disorder: Secondary | ICD-10-CM

## 2014-07-19 DIAGNOSIS — F332 Major depressive disorder, recurrent severe without psychotic features: Secondary | ICD-10-CM

## 2014-07-19 MED ORDER — RISPERIDONE 0.5 MG PO TABS: 0.5000 mg | ORAL_TABLET | Freq: Every day | ORAL | Status: DC

## 2014-07-19 MED ORDER — SERTRALINE HCL 100 MG PO TABS: 200.0000 mg | ORAL_TABLET | Freq: Every day | ORAL | Status: DC
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
## 2014-07-26 ENCOUNTER — Encounter (HOSPITAL_COMMUNITY): Payer: Self-pay | Admitting: Cardiovascular Disease

## 2014-09-20 ENCOUNTER — Encounter (HOSPITAL_COMMUNITY): Payer: Self-pay | Admitting: Psychiatry

## 2014-09-20 ENCOUNTER — Encounter (INDEPENDENT_AMBULATORY_CARE_PROVIDER_SITE_OTHER): Payer: Self-pay

## 2014-09-20 ENCOUNTER — Ambulatory Visit (INDEPENDENT_AMBULATORY_CARE_PROVIDER_SITE_OTHER): Payer: Commercial Managed Care - HMO | Admitting: Psychiatry

## 2014-09-20 VITALS — BP 98/55 | HR 80 | Ht 60.0 in | Wt 168.0 lb

## 2014-09-20 DIAGNOSIS — F332 Major depressive disorder, recurrent severe without psychotic features: Secondary | ICD-10-CM

## 2014-09-20 DIAGNOSIS — F411 Generalized anxiety disorder: Secondary | ICD-10-CM

## 2014-09-20 MED ORDER — SERTRALINE HCL 100 MG PO TABS: 200.0000 mg | ORAL_TABLET | Freq: Every day | ORAL | Status: DC

## 2014-09-20 MED ORDER — RISPERIDONE 0.5 MG PO TABS: 0.5000 mg | ORAL_TABLET | Freq: Every day | ORAL | Status: DC
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
## 2014-11-16 ENCOUNTER — Telehealth (HOSPITAL_COMMUNITY): Payer: Self-pay | Admitting: Psychiatry

## 2014-11-19 NOTE — Telephone Encounter (Signed)
PT Needs a refill on sertraline (ZOLOFT) 100 MG tablet [id number] - [place], [place] [address] [phone] (Phone)

## 2014-11-20 NOTE — Telephone Encounter (Signed)
zoloft sent to pharmacy to file.

## 2014-11-27 ENCOUNTER — Ambulatory Visit (INDEPENDENT_AMBULATORY_CARE_PROVIDER_SITE_OTHER): Payer: Commercial Managed Care - HMO | Admitting: Psychiatry

## 2014-11-27 DIAGNOSIS — F332 Major depressive disorder, recurrent severe without psychotic features: Secondary | ICD-10-CM

## 2014-11-27 DIAGNOSIS — F411 Generalized anxiety disorder: Secondary | ICD-10-CM | POA: Diagnosis not present

## 2014-11-27 MED ORDER — SERTRALINE HCL 100 MG PO TABS: 200.0000 mg | ORAL_TABLET | Freq: Every day | ORAL | Status: DC

## 2014-11-27 MED ORDER — RISPERIDONE 0.5 MG PO TABS: 0.5000 mg | ORAL_TABLET | Freq: Every day | ORAL | Status: DC
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
## 2015-02-14 ENCOUNTER — Telehealth: Payer: Self-pay | Admitting: Family Medicine

## 2015-02-14 DIAGNOSIS — Z1211 Encounter for screening for malignant neoplasm of colon: Secondary | ICD-10-CM

## 2015-02-14 NOTE — Telephone Encounter (Signed)
Estill Bamberg called from [facility] and stated that your pt Melissa Lewis had set up an appt for a colonoscopy and GED on July 11th and with her insurance she needs a referral from the primary care doctor. Thanks

## 2015-02-18 NOTE — Telephone Encounter (Signed)
Amber, [place] to place referral.

## 2015-02-19 NOTE — Telephone Encounter (Signed)
Referral placed. Pt notified.  

## 2015-02-19 NOTE — Addendum Note (Signed)
Addended by: Beatris Ship L on: 02/19/2015 08:16 AM   Modules accepted: Orders

## 2015-02-26 ENCOUNTER — Ambulatory Visit (HOSPITAL_COMMUNITY): Payer: Self-pay | Admitting: Psychiatry

## 2015-02-28 ENCOUNTER — Ambulatory Visit (INDEPENDENT_AMBULATORY_CARE_PROVIDER_SITE_OTHER): Payer: Commercial Managed Care - HMO | Admitting: Psychiatry

## 2015-02-28 ENCOUNTER — Encounter (HOSPITAL_COMMUNITY): Payer: Self-pay | Admitting: Psychiatry

## 2015-02-28 VITALS — BP 97/80 | HR 80 | Ht 59.0 in | Wt 170.0 lb

## 2015-02-28 DIAGNOSIS — F411 Generalized anxiety disorder: Secondary | ICD-10-CM

## 2015-02-28 DIAGNOSIS — Z634 Disappearance and death of family member: Secondary | ICD-10-CM | POA: Diagnosis not present

## 2015-02-28 DIAGNOSIS — F332 Major depressive disorder, recurrent severe without psychotic features: Secondary | ICD-10-CM

## 2015-02-28 MED ORDER — RISPERIDONE 0.5 MG PO TABS: 0.5000 mg | ORAL_TABLET | Freq: Every day | ORAL | Status: DC

## 2015-02-28 MED ORDER — SERTRALINE HCL 100 MG PO TABS: 200.0000 mg | ORAL_TABLET | Freq: Every day | ORAL | Status: DC
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
## 2015-05-20 ENCOUNTER — Encounter (HOSPITAL_COMMUNITY): Payer: Self-pay | Admitting: Psychiatry

## 2015-05-20 ENCOUNTER — Ambulatory Visit (INDEPENDENT_AMBULATORY_CARE_PROVIDER_SITE_OTHER): Payer: Medicare HMO | Admitting: Psychiatry

## 2015-05-20 VITALS — BP 118/84 | HR 74 | Ht 59.0 in | Wt 179.0 lb

## 2015-05-20 DIAGNOSIS — Z634 Disappearance and death of family member: Secondary | ICD-10-CM

## 2015-05-20 DIAGNOSIS — F411 Generalized anxiety disorder: Secondary | ICD-10-CM

## 2015-05-20 DIAGNOSIS — F332 Major depressive disorder, recurrent severe without psychotic features: Secondary | ICD-10-CM

## 2015-05-20 MED ORDER — SERTRALINE HCL 100 MG PO TABS: 200.0000 mg | ORAL_TABLET | Freq: Every day | ORAL | Status: DC

## 2015-05-20 MED ORDER — RISPERIDONE 0.5 MG PO TABS: 0.5000 mg | ORAL_TABLET | Freq: Every day | ORAL | Status: DC
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
## 2015-08-22 ENCOUNTER — Ambulatory Visit (INDEPENDENT_AMBULATORY_CARE_PROVIDER_SITE_OTHER): Payer: Medicare HMO | Admitting: Psychiatry

## 2015-08-22 ENCOUNTER — Encounter (HOSPITAL_COMMUNITY): Payer: Self-pay | Admitting: Psychiatry

## 2015-08-22 VITALS — BP 124/68 | HR 83 | Ht 59.0 in | Wt 183.0 lb

## 2015-08-22 DIAGNOSIS — F332 Major depressive disorder, recurrent severe without psychotic features: Secondary | ICD-10-CM

## 2015-08-22 DIAGNOSIS — Z634 Disappearance and death of family member: Secondary | ICD-10-CM | POA: Diagnosis not present

## 2015-08-22 DIAGNOSIS — F411 Generalized anxiety disorder: Secondary | ICD-10-CM | POA: Diagnosis not present

## 2015-08-22 MED ORDER — RISPERIDONE 0.5 MG PO TABS: 0.5000 mg | ORAL_TABLET | Freq: Every day | ORAL | Status: DC

## 2015-08-22 MED ORDER — SERTRALINE HCL 100 MG PO TABS: 200.0000 mg | ORAL_TABLET | Freq: Every day | ORAL | Status: DC
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
## 2015-10-08 ENCOUNTER — Telehealth (HOSPITAL_COMMUNITY): Payer: Self-pay | Admitting: *Deleted

## 2015-10-08 NOTE — Telephone Encounter (Signed)
Pt would like to speak with Dr. De Nurse about stopping Risperidal. Please call to advise @ [phone].

## 2015-10-09 ENCOUNTER — Telehealth (HOSPITAL_COMMUNITY): Payer: Self-pay

## 2015-10-10 NOTE — Telephone Encounter (Signed)
Called patient, she can start taking half of 0.5 mg risperdal for next one week. And then stop it. Will schedule to see her within 3 weeks.

## 2015-11-21 ENCOUNTER — Ambulatory Visit (INDEPENDENT_AMBULATORY_CARE_PROVIDER_SITE_OTHER): Payer: Medicare HMO | Admitting: Psychiatry

## 2015-11-21 ENCOUNTER — Encounter (HOSPITAL_COMMUNITY): Payer: Self-pay | Admitting: Psychiatry

## 2015-11-21 VITALS — BP 126/64 | HR 72 | Ht 59.0 in | Wt 178.0 lb

## 2015-11-21 DIAGNOSIS — Z634 Disappearance and death of family member: Secondary | ICD-10-CM | POA: Diagnosis not present

## 2015-11-21 DIAGNOSIS — F332 Major depressive disorder, recurrent severe without psychotic features: Secondary | ICD-10-CM | POA: Diagnosis not present

## 2015-11-21 DIAGNOSIS — F411 Generalized anxiety disorder: Secondary | ICD-10-CM | POA: Diagnosis not present

## 2015-11-21 MED ORDER — SERTRALINE HCL 100 MG PO TABS: 200.0000 mg | ORAL_TABLET | Freq: Every day | ORAL | Status: AC
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
## 2015-12-27 ENCOUNTER — Emergency Department
Admission: EM | Admit: 2015-12-27 | Discharge: 2015-12-27 | Disposition: A | Discharge status: Home / Self Care | Payer: Medicare HMO | Source: Home / Self Care | Attending: Emergency Medicine | Admitting: Emergency Medicine

## 2015-12-27 ENCOUNTER — Encounter: Payer: Self-pay | Admitting: *Deleted

## 2015-12-27 ENCOUNTER — Emergency Department (INDEPENDENT_AMBULATORY_CARE_PROVIDER_SITE_OTHER): Payer: Medicare HMO

## 2015-12-27 DIAGNOSIS — S39012A Strain of muscle, fascia and tendon of lower back, initial encounter: Secondary | ICD-10-CM

## 2015-12-27 DIAGNOSIS — M545 Low back pain, unspecified: Secondary | ICD-10-CM

## 2015-12-27 DIAGNOSIS — S3992XA Unspecified injury of lower back, initial encounter: Secondary | ICD-10-CM | POA: Diagnosis not present

## 2015-12-27 DIAGNOSIS — X501XXA Overexertion from prolonged static or awkward postures, initial encounter: Secondary | ICD-10-CM | POA: Diagnosis not present

## 2015-12-27 MED ORDER — BACLOFEN 10 MG PO TABS: 10.0000 mg | ORAL_TABLET | Freq: Two times a day (BID) | ORAL | Status: AC

## 2015-12-27 MED ORDER — MELOXICAM 15 MG PO TABS: 15.0000 mg | ORAL_TABLET | Freq: Every day | ORAL | Status: DC

## 2015-12-27 NOTE — ED Notes (Signed)
Pt c/o low back pain that started suddenly while washing he back in the shower 2 days ago. Taken some Baclofen with relief.
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
## 2016-02-13 ENCOUNTER — Ambulatory Visit (HOSPITAL_COMMUNITY): Payer: Self-pay | Admitting: Psychiatry

## 2016-12-26 ENCOUNTER — Emergency Department (INDEPENDENT_AMBULATORY_CARE_PROVIDER_SITE_OTHER)
Admission: EM | Admit: 2016-12-26 | Discharge: 2016-12-26 | Disposition: A | Discharge status: Home / Self Care | Payer: Medicare HMO | Source: Home / Self Care | Attending: Family Medicine | Admitting: Family Medicine

## 2016-12-26 ENCOUNTER — Encounter: Payer: Self-pay | Admitting: Emergency Medicine

## 2016-12-26 DIAGNOSIS — B370 Candidal stomatitis: Secondary | ICD-10-CM

## 2016-12-26 MED ORDER — NYSTATIN 100000 UNIT/ML MT SUSP: 5.0000 mL | Freq: Four times a day (QID) | OROMUCOSAL | 0 refills | Status: DC
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
## 2016-12-26 NOTE — Discharge Instructions (Signed)
Try warm salt water gargles several times daily. ?

## 2016-12-26 NOTE — ED Triage Notes (Signed)
Patient presents to [facility] with a complaint of dry mouth. Patient states her tongue in white in color as well as her cheeks

## 2016-12-30 ENCOUNTER — Telehealth: Payer: Self-pay | Admitting: *Deleted

## 2016-12-30 MED ORDER — NYSTATIN 100000 UNIT/ML MT SUSP: 5.0000 mL | Freq: Four times a day (QID) | OROMUCOSAL | 0 refills | Status: AC

## 2016-12-30 NOTE — Telephone Encounter (Signed)
Pt called reports that her thrush has not completely resolved and she would like a refill of the nystatin. Ok per dr Assunta Found. Called to pharmacy.

## 2017-01-25 ENCOUNTER — Encounter: Payer: Self-pay | Admitting: Emergency Medicine

## 2017-01-25 ENCOUNTER — Emergency Department
Admission: EM | Admit: 2017-01-25 | Discharge: 2017-01-25 | Disposition: A | Discharge status: Home / Self Care | Payer: Medicare HMO | Source: Home / Self Care | Attending: Family Medicine | Admitting: Family Medicine

## 2017-01-25 DIAGNOSIS — B029 Zoster without complications: Secondary | ICD-10-CM | POA: Diagnosis not present

## 2017-01-25 MED ORDER — VALACYCLOVIR HCL 1 G PO TABS: 1000.0000 mg | ORAL_TABLET | Freq: Three times a day (TID) | ORAL | 0 refills | Status: AC
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
## 2017-01-25 NOTE — ED Triage Notes (Signed)
Pt c/o rash on left side of breast she noticed this yesterday. Painful. States she was dx with shingles last year.

## 2017-05-23 ENCOUNTER — Emergency Department
Admission: EM | Admit: 2017-05-23 | Discharge: 2017-05-23 | Discharge status: Absent without leave | Payer: Self-pay | Source: Home / Self Care

## 2017-07-21 IMAGING — DX DG LUMBAR SPINE COMPLETE 4+V
5 series · 5 of 5 positions shown · non-contrast
Comparison: 11/24/2015

CLINICAL DATA: Twisting back injury 2 days ago with persistent
left-sided low back pain.

EXAM:
LUMBAR SPINE - COMPLETE 4+ VIEW

[l-spine ap]
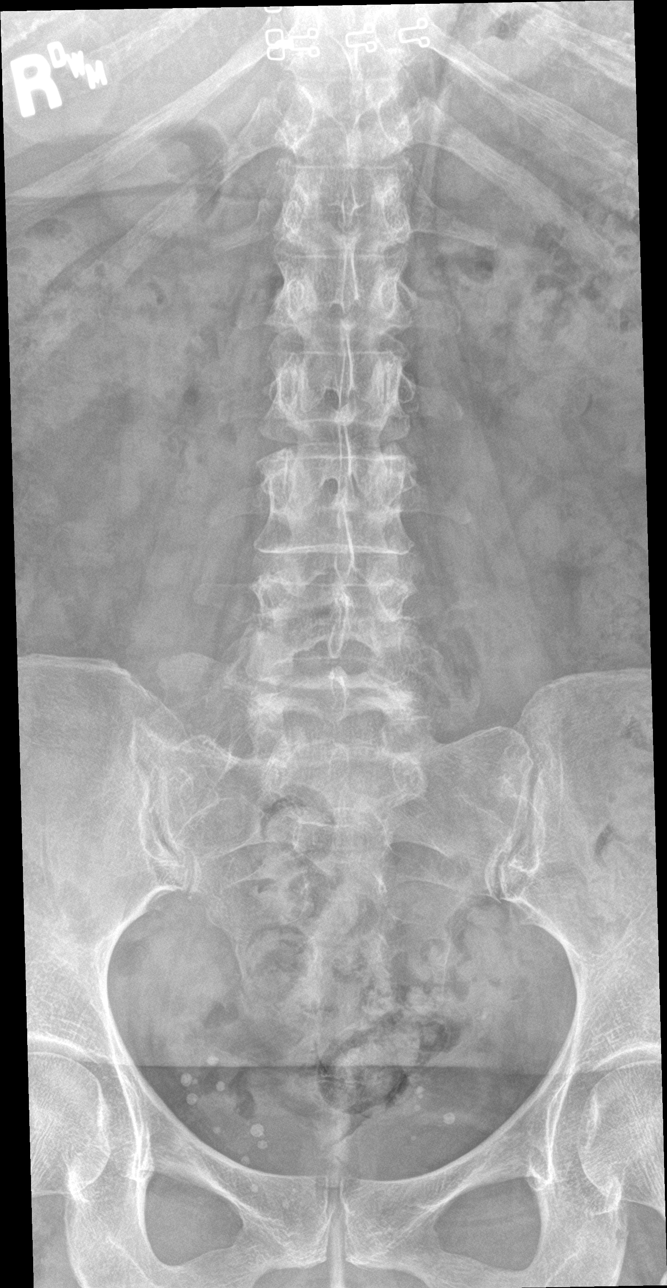

[l-spine obl (1 of 2)]
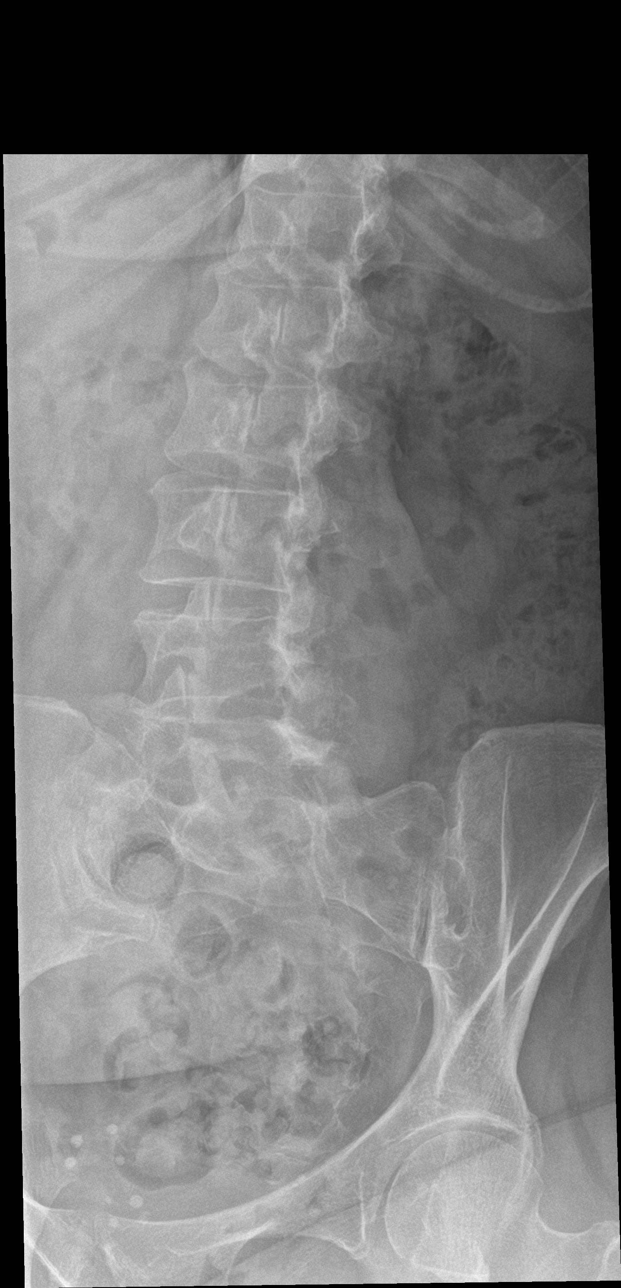

[l-spine obl (2 of 2)]
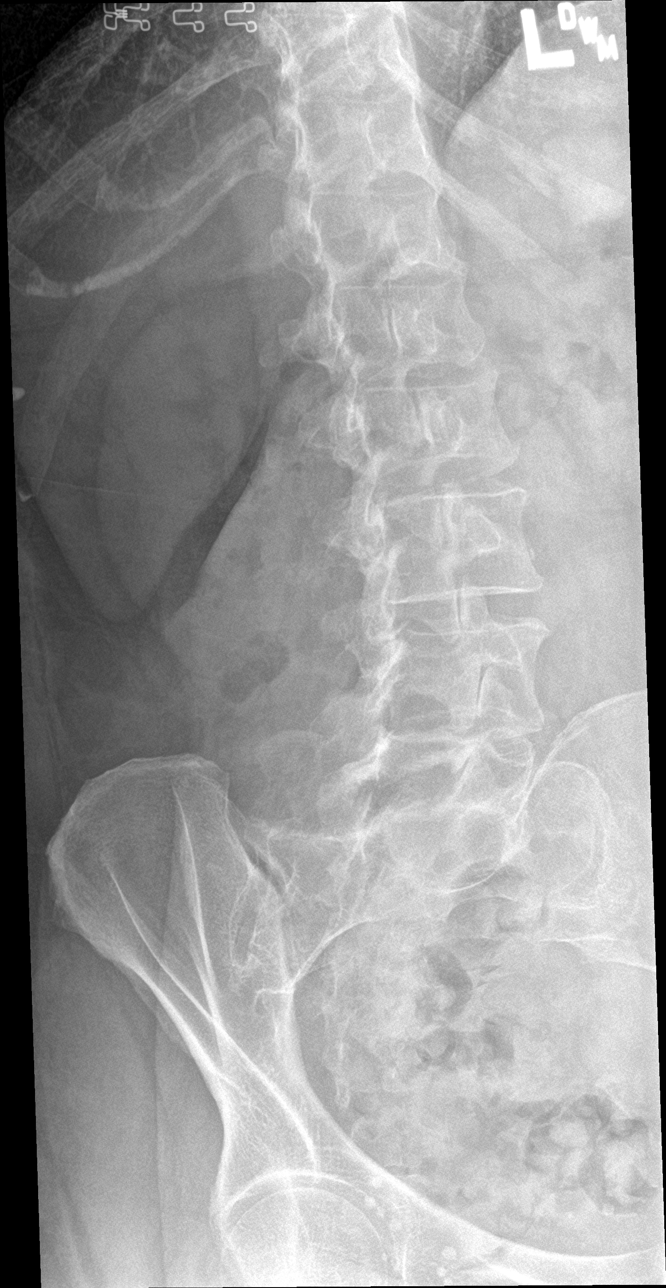

[l-spine lat]
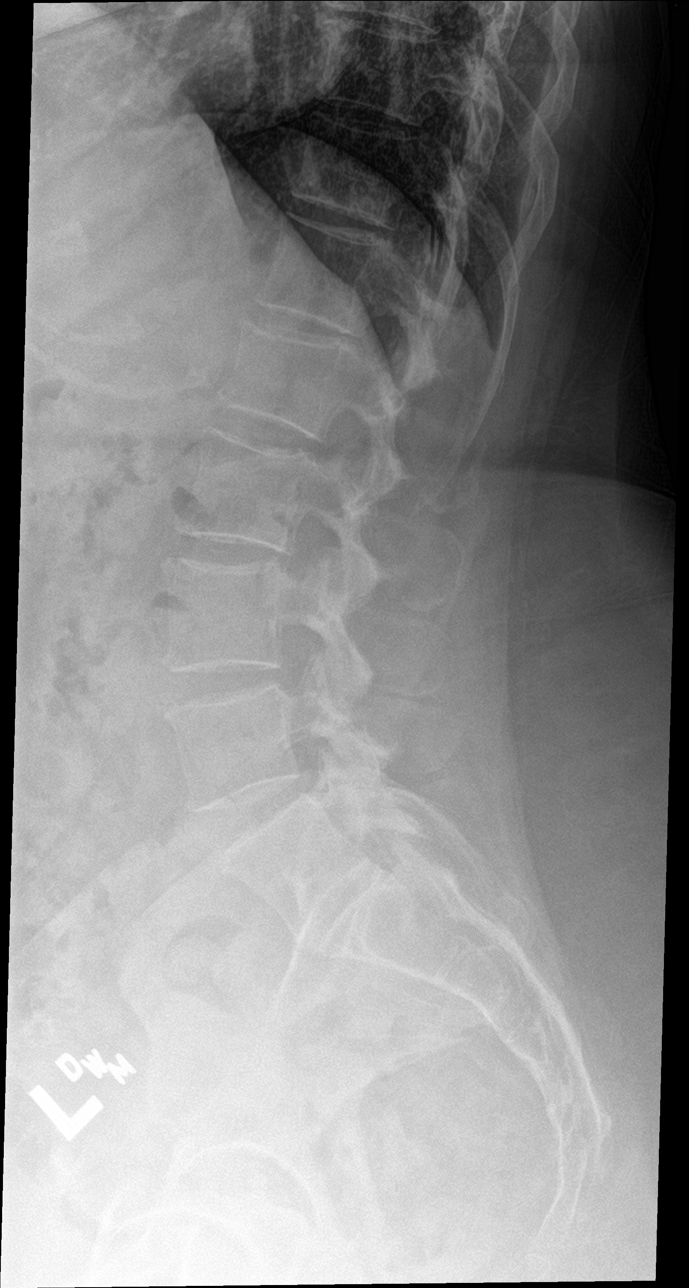

[l-spine spot]
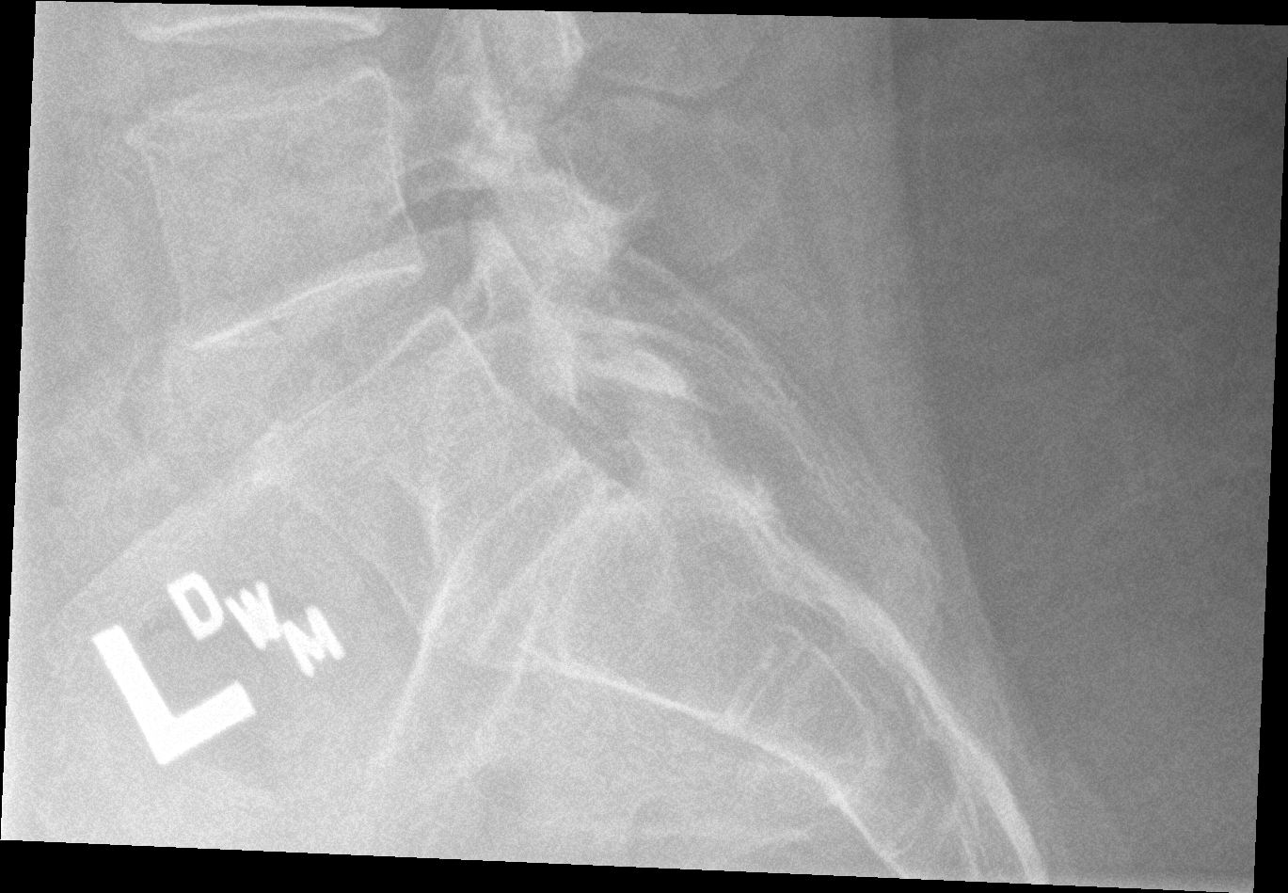

[5 of 5 positions shown; findings below may reference images not displayed]

FINDINGS: Anatomy is transitional. There are diminutive last ribs. The lowest
non-rib-bearing vertebra is transitional with sacralization on the
right. Alignment is normal. No evidence of fracture. No disc space
narrowing. There is lower lumbar facet degeneration without
slippage. Sacroiliac joints appear normal.
IMPRESSION: No acute finding. Transitional anatomy. Lower lumbar facet
degeneration.

## 2023-01-10 ENCOUNTER — Ambulatory Visit
Admission: EM | Admit: 2023-01-10 | Discharge: 2023-01-10 | Disposition: A | Discharge status: Home / Self Care | Payer: 59 | Attending: Internal Medicine | Admitting: Internal Medicine

## 2023-01-10 ENCOUNTER — Encounter: Payer: Self-pay | Admitting: Emergency Medicine

## 2023-01-10 DIAGNOSIS — J029 Acute pharyngitis, unspecified: Secondary | ICD-10-CM | POA: Diagnosis not present

## 2023-01-10 MED ORDER — PHENOL 1.4 % MT LIQD: 1.0000 | OROMUCOSAL | 0 refills | Status: AC | PRN

## 2023-01-10 NOTE — ED Triage Notes (Signed)
Patient c/o left ear pain and sore throat, especially when swallowing x 3 days.  Patient denies any OTC pain/cold meds.

## 2023-01-10 NOTE — Discharge Instructions (Addendum)
Warm salt water gargle Warm tea with lemon as needed to help soothe your throat. Chloraseptic throat spray as needed for pain Continue taking ibuprofen as needed for pain If you have worsening symptoms please return to urgent care to be reevaluated.
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
